# Patient Record
Sex: Female | Born: 1975 | Race: White | Hispanic: No | Marital: Married | Smoking: Never smoker
Health system: Southern US, Community
[De-identification: ages and names within clinical notes are randomized; demographics above are authoritative.]

## PROBLEM LIST (undated history)

## (undated) DIAGNOSIS — F419 Anxiety disorder, unspecified: Secondary | ICD-10-CM

## (undated) DIAGNOSIS — G43909 Migraine, unspecified, not intractable, without status migrainosus: Secondary | ICD-10-CM

## (undated) DIAGNOSIS — R42 Dizziness and giddiness: Secondary | ICD-10-CM

## (undated) HISTORY — PX: HERNIA REPAIR: SHX51

## (undated) HISTORY — DX: Anxiety disorder, unspecified: F41.9

## (undated) HISTORY — DX: Migraine, unspecified, not intractable, without status migrainosus: G43.909

## (undated) HISTORY — DX: Dizziness and giddiness: R42

## (undated) HISTORY — PX: APPENDECTOMY: SHX54

---
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
## 2002-08-25 ENCOUNTER — Other Ambulatory Visit: Admission: RE | Admit: 2002-08-25 | Discharge: 2002-08-25 | Payer: Self-pay | Admitting: Gynecology

## 2003-09-06 ENCOUNTER — Other Ambulatory Visit: Admission: RE | Admit: 2003-09-06 | Discharge: 2003-09-06 | Payer: Self-pay | Admitting: Obstetrics and Gynecology

## 2004-10-08 ENCOUNTER — Other Ambulatory Visit: Admission: RE | Admit: 2004-10-08 | Discharge: 2004-10-08 | Payer: Self-pay | Admitting: Obstetrics and Gynecology

## 2005-04-07 ENCOUNTER — Other Ambulatory Visit: Admission: RE | Admit: 2005-04-07 | Discharge: 2005-04-07 | Payer: Self-pay | Admitting: Obstetrics and Gynecology

## 2005-08-19 ENCOUNTER — Inpatient Hospital Stay (HOSPITAL_COMMUNITY): Admission: RE | Admit: 2005-08-19 | Discharge: 2005-08-22 | Payer: Self-pay | Admitting: Obstetrics and Gynecology

## 2005-10-21 ENCOUNTER — Other Ambulatory Visit: Admission: RE | Admit: 2005-10-21 | Discharge: 2005-10-21 | Payer: Self-pay | Admitting: Obstetrics and Gynecology

## 2006-06-23 ENCOUNTER — Encounter (INDEPENDENT_AMBULATORY_CARE_PROVIDER_SITE_OTHER): Payer: Self-pay | Admitting: *Deleted

## 2006-06-23 ENCOUNTER — Inpatient Hospital Stay (HOSPITAL_COMMUNITY): Admission: AD | Admit: 2006-06-23 | Discharge: 2006-06-23 | Payer: Self-pay | Admitting: Obstetrics and Gynecology

## 2006-06-23 ENCOUNTER — Ambulatory Visit (HOSPITAL_COMMUNITY): Admission: AD | Admit: 2006-06-23 | Discharge: 2006-06-24 | Payer: Self-pay | Admitting: *Deleted

## 2006-06-23 ENCOUNTER — Encounter (HOSPITAL_COMMUNITY): Payer: Self-pay | Admitting: Obstetrics and Gynecology

## 2008-07-04 ENCOUNTER — Inpatient Hospital Stay (HOSPITAL_COMMUNITY): Admission: RE | Admit: 2008-07-04 | Discharge: 2008-07-06 | Payer: Self-pay | Admitting: Obstetrics and Gynecology

## 2009-12-10 ENCOUNTER — Ambulatory Visit: Payer: Self-pay | Admitting: Genetic Counselor

## 2011-02-18 NOTE — Op Note (Signed)
[name], Kelsey              ACCOUNT NO.:  [id number]   MEDICAL RECORD NO.:  [id number]          PATIENT TYPE:  INP   LOCATION:  9134                          FACILITY:  [facility]   PHYSICIAN:  Michelle L. Grewal, M.D.DATE OF BIRTH:  Feb 04, 1976   DATE OF PROCEDURE:  07/04/2008  DATE OF DISCHARGE:                               OPERATIVE REPORT   PREOPERATIVE DIAGNOSIS:  Intrauterine pregnancy at 39 weeks and previous  C-section.   POSTOPERATIVE DIAGNOSIS:  Intrauterine pregnancy at 39 weeks and  previous C-section.   PROCEDURE:  Repeat low transverse cesarean section.   SURGEON:  Michelle L. Grewal, MD   ANESTHESIA:  Spinal.   FINDINGS:  Apgars 8 at one minute and 9 at five minutes.  Female infant  weighing 7 pounds 11 ounces.   ESTIMATED BLOOD LOSS:  500 mL.   PATHOLOGY:  None.   DRAINS:  Foley catheter.   COMPLICATIONS:  None.   PROCEDURE IN DETAIL:  The patient was taken to the operating room and  after informed consent was obtained, she was prepped and draped in usual  sterile fashion after spinal was placed without incident by Dr. Tacy Dura.  Foley catheter was inserted and draining clear urine.  A low transverse  incision was made, carried down to the fascia, and fascia scored in the  midline, extended laterally.  The rectus muscles were separated in the  midline.  The peritoneum was entered bluntly.  The peritoneal incision  was then stretched.  The bladder blade was inserted and the lower  uterine segment was identified, and then the bladder flap was created  sharply and then digitally.  The bladder blade was readjusted.  A low  transverse incision was made using a scalpel and the uterus was entered  using a hemostat.  Amniotic fluid was clear.  The baby was in cephalic  presentation and was delivered quite easily with the assistance of a  vacuum.  The baby was a female infant weighing 7 pounds 11 ounces, Apgars  8 at one minute and 9 at five minutes.  After the baby  was delivered, I  handed into the awaiting neonatal team and taken to the nursery.  The  placenta was manually removed and noted to be normal intact with a three-  vessel cord.  Of note, there was also a loose nuchal cord at the time of  delivery.  The uterus was exteriorized and cleared of all clots and  debris.  Pitocin and antibiotics have been given.  The uterus was closed  in one layer using 0 chromic in a running locked stitch.  Hemostasis was  excellent.  The uterus was returned to the abdomen.  Irrigation was  performed and hemostasis was again noted.  The peritoneum and rectus  muscles were reapproximated using 0 Vicryl.  The fascia was closed using  0 Vicryl, running stitch, and after irrigation of the subcutaneous  layer, the skin was closed with a subcuticular using 3-0 Vicryl and the  skin was closed with Dermabond skin adhesive.  All sponge, lap, and  instrument counts were correct x2.  The  patient went to recovery room in  stable condition.      Michelle L. Vincente Poli, M.D.  Electronically Signed     MLG/MEDQ  D:  07/04/2008  T:  07/04/2008  Job:  [id number]

## 2011-02-21 NOTE — H&P (Signed)
Kelsey [name], SARRATT NO.:  [id number]   MEDICAL RECORD NO.:  [id number]          PATIENT TYPE:  MAT   LOCATION:  MATC                          FACILITY:  [facility]   PHYSICIAN:  Alfonse Ras, MD   DATE OF BIRTH:  [birth year]   DATE OF ADMISSION:  06/23/2006  DATE OF DISCHARGE:  06/23/2006                                HISTORY & PHYSICAL   ADMISSION DIAGNOSIS:  Acute appendicitis.   ADMISSION PHYSICIAN:  Alfonse Ras, MD.   HOPI:  Patient is a 35 year old female with about an 18-20 hour history of  abdominal pain which is now localized to her right lower quadrant.  She was  seen by Dr. Vincente Poli and had a CAT scan which showed acute appendicitis.  CT  scan done today was consistent with acute appendicitis.  Patient had a white  count of 9.6 thousand.   PAST MEDICAL HISTORY:  None.   PAST SURGICAL HISTORY:  Significant for C-section 10 months ago.   MEDICATIONS:  None.   Patient is breastfeeding.   PHYSICAL EXAM:  She is an age appropriate white female in minimal distress.  She is quite anxious.  HEENT EXAM:  Benign.  Normocephalic, atraumatic.  Pupils are equal, round,  reactive to light.  LUNGS:  Clear to auscultation and percussion x2.  HEART:  Regular rate and rhythm.  ABDOMEN:  Soft but quite tender in the right lower quadrant with positive  rebound signs.  She has consistency with localized peritonitis consistent  with acute appendicitis.  EXTREMITIES:  No clubbing, cyanosis or edema.   IMPRESSION:  Acute appendicitis.   PLAN:  Transfer from [facility] to [facility] for  laparoscopic appendectomy.  I discussed the risks, benefits with the patient  and her husband including bleeding, infection, risk of having to convert to  an open procedure.  They understand and wish to proceed.      Alfonse Ras, MD  Electronically Signed     KRE/MEDQ  D:  06/23/2006  T:  06/23/2006  Job:  [id number]   cc:   Marcelino Duster L. Vincente Poli, M.D.  Fax: [phone]

## 2011-02-21 NOTE — Discharge Summary (Signed)
NAMEHILDA, Kelsey [name]              ACCOUNT NO.:  [id number]   MEDICAL RECORD NO.:  [id number]          PATIENT TYPE:  INP   LOCATION:  9134                          [facility]ACILITY:  [facility]   PHYSICIAN:  Guy Sandifer. Henderson Cloud, M.D. DATE OF BIRTH:  Feb 27, 1976   DATE OF ADMISSION:  07/04/2008  DATE OF DISCHARGE:  07/06/2008                               DISCHARGE SUMMARY   ADMITTING DIAGNOSES:  1. Intrauterine pregnancy at 19 weeks estimated gestational age.  2. Previous cesarean section, desires repeat.   DISCHARGE DIAGNOSES:  1. Status post low transverse cesarean section,  2. Viable female infant.   PROCEDURE:  Repeat low transverse cesarean section.   REASON FOR ADMISSION:  Please see written H&P.   HOSPITAL COURSE:  The patient is a 35 year old white married female  gravida 2, para 1 that presented to [facility] at 39  weeks estimated gestational age for scheduled cesarean section.  The  patient had had a previous cesarean section, desired repeat.  On the  morning of admission, the patient was taken to the operating room where  spinal anesthesia was admitted without difficulty.  A tow transverse  incision was made with delivery of a viable female infant weighing 7  pounds 11 ounces with Apgars of 9 at 1 minute and 9 at 5 minutes.  The  patient tolerated the procedure well and was taken to the recovery room  in stable condition.  On postoperative day #1, the patient was without  complaint.  Vital signs were stable.  She was afebrile.  Abdomen soft  with good return of bowel function.  Fundus was firm and nontender.  Incision was clean, dry, and intact.  Subcuticular closure was noted.  Laboratory findings revealed hemoglobin of 10.6, platelet count of  191,000, WBC count of 8.9.  Blood type was noted to be A+.  On  postoperative day #2, the patient did desire early discharge.  Vital  signs remained stable.  She was afebrile.  Abdomen soft.  Fundus firm  and nontender.  Incision  was clean, dry, and intact.  Discharge  instructions were reviewed and the patient was later discharged to home.   CONDITION ON DISCHARGE:  Stable.   DIET:  Regular as tolerated.   ACTIVITY:  No heavy lifting.  No driving x2 weeks.  No vaginal entry.   FOLLOWUP:  The patient will follow up in the office in 1-2 weeks for an  incision check.  She is to call for temperature greater than 100  degrees, persistent nausea, vomiting, heavy vaginal bleeding and/or  redness or drainage from incisional site.   DISCHARGE MEDICATIONS:  1. Tylox #30 one p.o. 4-6 hours p.r.n.  2. Motrin 600 mg every 6 hours.  3. Prenatal vitamins 1 p.o. daily.  4. Colace 1 p.o. daily p.r.n.      Julio Sicks, N.P.      Guy Sandifer. Henderson Cloud, M.D.  Electronically Signed    CC/MEDQ  D:  07/31/2008  T:  07/31/2008  Job:  [id number]

## 2011-03-14 ENCOUNTER — Institutional Professional Consult (permissible substitution): Payer: Self-pay | Admitting: Cardiology

## 2011-04-02 ENCOUNTER — Encounter: Payer: Self-pay | Admitting: Cardiology

## 2011-04-03 ENCOUNTER — Institutional Professional Consult (permissible substitution): Payer: Self-pay | Admitting: Cardiology

## 2011-05-12 ENCOUNTER — Encounter: Payer: Self-pay | Admitting: Cardiology

## 2011-05-13 ENCOUNTER — Encounter: Payer: Self-pay | Admitting: Cardiology

## 2011-05-13 ENCOUNTER — Ambulatory Visit (INDEPENDENT_AMBULATORY_CARE_PROVIDER_SITE_OTHER): Payer: BC Managed Care – PPO | Admitting: Cardiology

## 2011-05-13 VITALS — BP 133/92 | HR 87 | Resp 18 | Ht 63.0 in | Wt 133.3 lb

## 2011-05-13 DIAGNOSIS — R002 Palpitations: Secondary | ICD-10-CM

## 2011-05-13 NOTE — Patient Instructions (Signed)
Your physician has requested that you have an echocardiogram. Echocardiography is a painless test that uses sound waves to create images of your heart. It provides your doctor with information about the size and shape of your heart and how well your heart's chambers and valves are working. This procedure takes approximately one hour. There are no restrictions for this procedure.  Your physician recommends that you do not need to schedule a follow-up appointment at this time.

## 2011-05-13 NOTE — Progress Notes (Signed)
HPI Kelsey [name] is a delightful 35 year old married white female referred today by Dr. Vincente Poli for the evaluation and management of palpitations.  She describes having these intermittently for quite some time. However, she's been having them almost daily lately. She is under a lot of stress describes herself as a type A individual. She's also had a recent onset of migraine headaches.  The palpitations usually occur at random but seem to be exacerbated by stressful situations. She exercises 3-4 times a week but this is usually with her kids running around the treadmill.   She drinks about 6-8 ounces of caffeinated coffee per day. She on occasion one glass of wine. She does not binge drink.  Her palpitations are not associate with exercise. She has had no syncope or presyncope. She denies any chest pain. The palpitations also do not seem to be related to her migraines.  Her EKG today shows normal sinus rhythm, normal EKG. Her thyroid was recently tested and she was told it was normal. Past Medical History  Diagnosis Date  . Dizziness   . Migraine headache   . Anxiety     No past surgical history on file.  Family History  Problem Relation Age of Onset  . Lupus      family hx of    History   Social History  . Marital Status: Married    Spouse Name: N/A    Number of Children: N/A  . Years of Education: N/A   Occupational History  . Not on file.   Social History Main Topics  . Smoking status: Never Smoker   . Smokeless tobacco: Not on file  . Alcohol Use: Yes     1-2 per week  . Drug Use: No  . Sexually Active: Not on file   Other Topics Concern  . Not on file   Social History Narrative  . No narrative on file    No Known Allergies  Current Outpatient Prescriptions  Medication Sig Dispense Refill  . ALPRAZolam (XANAX) 0.25 MG tablet Take 0.25 mg by mouth at bedtime as needed.        . etonogestrel-ethinyl estradiol (NUVARING) 0.12-0.015 MG/24HR vaginal ring Place 1  each vaginally every 28 (twenty-eight) days. Insert vaginally and leave in place for 3 consecutive weeks, then remove for 1 week.       . meclizine (ANTIVERT) 25 MG tablet Take 25 mg by mouth 3 (three) times daily as needed.        . naproxen sodium (ANAPROX) 220 MG tablet Take 220 mg by mouth 2 (two) times daily with a meal.          ROS Negative other than HPI.   PE General Appearance: well developed, well nourished in no acute distress HEENT: symmetrical face, PERRLA, good dentition  Neck: no JVD, thyromegaly, or adenopathy, trachea midline Chest: symmetric without deformity Cardiac: PMI non-displaced, RRR, normal S1, S2, no gallop or murmur, no obvious click Lung: clear to ausculation and percussion Vascular: all pulses full without bruits  Abdominal: nondistended, nontender, good bowel sounds, no HSM, no bruits Extremities: no cyanosis, clubbing or edema, no sign of DVT, no varicosities  Skin: normal color, no rashes Neuro: alert and oriented x 3, non-focal Pysch: normal affect Filed Vitals:   05/13/11 1619  BP: 133/92  Pulse: 87  Resp: 18  Height: 5\' 3"  (1.6 m)  Weight: 133 lb 4.8 oz (60.464 kg)    EKG  Labs and Studies Reviewed.   No results found  for this basename: WBC, HGB, HCT, MCV, PLT      Chemistry   No results found for this basename: NA, K, CL, CO2, BUN, CREATININE, GLU   No results found for this basename: CALCIUM, ALKPHOS, AST, ALT, BILITOT       No results found for this basename: CHOL   No results found for this basename: HDL   No results found for this basename: LDLCALC   No results found for this basename: TRIG   No results found for this basename: CHOLHDL   No results found for this basename: HGBA1C   No results found for this basename: ALT, AST, GGT, ALKPHOS, BILITOT   No results found for this basename: TSH

## 2011-05-14 ENCOUNTER — Encounter: Payer: Self-pay | Admitting: *Deleted

## 2011-05-15 ENCOUNTER — Ambulatory Visit (HOSPITAL_COMMUNITY): Payer: BC Managed Care – PPO | Attending: Cardiology

## 2011-05-15 DIAGNOSIS — I0989 Other specified rheumatic heart diseases: Secondary | ICD-10-CM | POA: Insufficient documentation

## 2011-05-15 DIAGNOSIS — I079 Rheumatic tricuspid valve disease, unspecified: Secondary | ICD-10-CM | POA: Insufficient documentation

## 2011-05-15 DIAGNOSIS — I059 Rheumatic mitral valve disease, unspecified: Secondary | ICD-10-CM | POA: Insufficient documentation

## 2011-05-15 DIAGNOSIS — R002 Palpitations: Secondary | ICD-10-CM | POA: Insufficient documentation

## 2011-05-16 ENCOUNTER — Telehealth: Payer: Self-pay | Admitting: *Deleted

## 2011-05-16 NOTE — Telephone Encounter (Signed)
Pt aware of echo results.  Pt continues to have palpitations. She would like to know if she could wear a holter monitor to pick up on these palpitations?

## 2011-05-16 NOTE — Telephone Encounter (Signed)
She is aware Dr. Daleen Squibb is out of the office today. I will forward this message to him for review. Pt is tearful and "frustrated" with the palpitations. Reassurance given. Pt will talk with her pcp, Dr. Vincente Poli, about previous "normal" labs she had drawn. Mylo Red RN

## 2011-06-11 ENCOUNTER — Telehealth: Payer: Self-pay | Admitting: *Deleted

## 2011-06-11 NOTE — Telephone Encounter (Signed)
Message copied by Theda Belfast on Wed Jun 11, 2011  3:23 PM ------      Message from: Deliah Boston      Created: Wed Jun 11, 2011 11:16 AM       Debbie  Not sure if you ever got this message  I am in [place]DS trying to clean off [employer] top

## 2011-06-11 NOTE — Telephone Encounter (Signed)
Follow-up call to pt.  Pt states she is feeling much better and has reduced her stress.  She has only "had a couple of episodes which were not very noticeable" Reassurance given to pt and encouraged her to call us if she had any further problems. Mylo Red RN

## 2011-07-07 LAB — CBC
MCHC: 33.9
MCV: 93.9
Platelets: 191
Platelets: 247
RDW: 14.2
WBC: 8.8

## 2011-07-07 LAB — RPR: RPR Ser Ql: NONREACTIVE

## 2012-09-07 ENCOUNTER — Ambulatory Visit: Payer: BC Managed Care – PPO | Admitting: Physical Therapy

## 2012-09-07 ENCOUNTER — Ambulatory Visit: Payer: BC Managed Care – PPO | Attending: Neurology | Admitting: Rehabilitative and Restorative Service Providers"

## 2012-09-07 DIAGNOSIS — IMO0001 Reserved for inherently not codable concepts without codable children: Secondary | ICD-10-CM | POA: Insufficient documentation

## 2012-09-07 DIAGNOSIS — R42 Dizziness and giddiness: Secondary | ICD-10-CM | POA: Insufficient documentation

## 2012-09-10 ENCOUNTER — Ambulatory Visit: Payer: BC Managed Care – PPO | Admitting: Rehabilitative and Restorative Service Providers"

## 2012-09-10 ENCOUNTER — Encounter: Payer: BC Managed Care – PPO | Admitting: *Deleted

## 2012-09-13 ENCOUNTER — Ambulatory Visit: Payer: BC Managed Care – PPO | Admitting: Rehabilitative and Restorative Service Providers"

## 2012-09-14 ENCOUNTER — Encounter: Payer: BC Managed Care – PPO | Admitting: *Deleted

## 2012-09-17 ENCOUNTER — Ambulatory Visit: Payer: BC Managed Care – PPO | Admitting: Rehabilitative and Restorative Service Providers"

## 2012-09-17 ENCOUNTER — Encounter: Payer: BC Managed Care – PPO | Admitting: *Deleted

## 2012-09-21 ENCOUNTER — Encounter: Payer: BC Managed Care – PPO | Admitting: *Deleted

## 2012-09-21 ENCOUNTER — Ambulatory Visit: Payer: BC Managed Care – PPO | Admitting: Rehabilitative and Restorative Service Providers"

## 2012-09-23 ENCOUNTER — Other Ambulatory Visit: Payer: Self-pay | Admitting: Obstetrics and Gynecology

## 2012-09-24 ENCOUNTER — Ambulatory Visit: Payer: BC Managed Care – PPO | Admitting: Rehabilitative and Restorative Service Providers"

## 2012-09-24 ENCOUNTER — Encounter: Payer: BC Managed Care – PPO | Admitting: *Deleted

## 2012-10-01 ENCOUNTER — Ambulatory Visit: Payer: BC Managed Care – PPO | Admitting: Rehabilitative and Restorative Service Providers"

## 2012-10-05 ENCOUNTER — Encounter: Payer: BC Managed Care – PPO | Admitting: Rehabilitative and Restorative Service Providers"

## 2012-10-22 ENCOUNTER — Ambulatory Visit: Payer: BC Managed Care – PPO | Admitting: Rehabilitative and Restorative Service Providers"

## 2013-06-09 ENCOUNTER — Other Ambulatory Visit: Payer: Self-pay | Admitting: Dermatology

## 2013-09-13 ENCOUNTER — Other Ambulatory Visit: Payer: Self-pay | Admitting: Gastroenterology

## 2013-09-13 DIAGNOSIS — R1011 Right upper quadrant pain: Secondary | ICD-10-CM

## 2013-09-23 ENCOUNTER — Other Ambulatory Visit: Payer: Self-pay | Admitting: Obstetrics and Gynecology

## 2013-09-27 ENCOUNTER — Encounter (HOSPITAL_COMMUNITY): Payer: Self-pay | Admitting: Emergency Medicine

## 2013-09-27 ENCOUNTER — Emergency Department (HOSPITAL_COMMUNITY): Payer: BC Managed Care – PPO

## 2013-09-27 ENCOUNTER — Emergency Department (HOSPITAL_COMMUNITY)
Admission: EM | Admit: 2013-09-27 | Discharge: 2013-09-27 | Disposition: A | Discharge status: Home / Self Care | Payer: BC Managed Care – PPO | Attending: Emergency Medicine | Admitting: Emergency Medicine

## 2013-09-27 DIAGNOSIS — R110 Nausea: Secondary | ICD-10-CM | POA: Insufficient documentation

## 2013-09-27 DIAGNOSIS — Z79899 Other long term (current) drug therapy: Secondary | ICD-10-CM | POA: Insufficient documentation

## 2013-09-27 DIAGNOSIS — R630 Anorexia: Secondary | ICD-10-CM | POA: Insufficient documentation

## 2013-09-27 DIAGNOSIS — Z9089 Acquired absence of other organs: Secondary | ICD-10-CM | POA: Insufficient documentation

## 2013-09-27 DIAGNOSIS — Z3202 Encounter for pregnancy test, result negative: Secondary | ICD-10-CM | POA: Insufficient documentation

## 2013-09-27 DIAGNOSIS — Z8679 Personal history of other diseases of the circulatory system: Secondary | ICD-10-CM | POA: Insufficient documentation

## 2013-09-27 DIAGNOSIS — R1011 Right upper quadrant pain: Secondary | ICD-10-CM | POA: Insufficient documentation

## 2013-09-27 DIAGNOSIS — Z9889 Other specified postprocedural states: Secondary | ICD-10-CM | POA: Insufficient documentation

## 2013-09-27 DIAGNOSIS — Z8659 Personal history of other mental and behavioral disorders: Secondary | ICD-10-CM | POA: Insufficient documentation

## 2013-09-27 DIAGNOSIS — R109 Unspecified abdominal pain: Secondary | ICD-10-CM

## 2013-09-27 LAB — CBC WITH DIFFERENTIAL/PLATELET
Basophils Absolute: 0 10*3/uL (ref 0.0–0.1)
Basophils Relative: 0 % (ref 0–1)
Eosinophils Absolute: 0 10*3/uL (ref 0.0–0.7)
Eosinophils Relative: 1 % (ref 0–5)
HCT: 38.7 % (ref 36.0–46.0)
Hemoglobin: 13.6 g/dL (ref 12.0–15.0)
Lymphocytes Relative: 10 % — ABNORMAL LOW (ref 12–46)
Lymphs Abs: 0.8 10*3/uL (ref 0.7–4.0)
MCH: 31.4 pg (ref 26.0–34.0)
MCHC: 35.1 g/dL (ref 30.0–36.0)
MCV: 89.4 fL (ref 78.0–100.0)
Monocytes Absolute: 0.5 10*3/uL (ref 0.1–1.0)
Monocytes Relative: 6 % (ref 3–12)
Neutro Abs: 6.8 10*3/uL (ref 1.7–7.7)
Neutrophils Relative %: 83 % — ABNORMAL HIGH (ref 43–77)
Platelets: 220 10*3/uL (ref 150–400)
RBC: 4.33 MIL/uL (ref 3.87–5.11)
RDW: 12.3 % (ref 11.5–15.5)
WBC: 8.1 10*3/uL (ref 4.0–10.5)

## 2013-09-27 LAB — COMPREHENSIVE METABOLIC PANEL WITH GFR
ALT: 12 U/L (ref 0–35)
AST: 15 U/L (ref 0–37)
Albumin: 4 g/dL (ref 3.5–5.2)
Alkaline Phosphatase: 30 U/L — ABNORMAL LOW (ref 39–117)
BUN: 8 mg/dL (ref 6–23)
CO2: 26 meq/L (ref 19–32)
Calcium: 9.5 mg/dL (ref 8.4–10.5)
Chloride: 101 meq/L (ref 96–112)
Creatinine, Ser: 0.75 mg/dL (ref 0.50–1.10)
GFR calc Af Amer: >90 mL/min
GFR calc non Af Amer: >90 mL/min
Glucose, Bld: 100 mg/dL — ABNORMAL HIGH (ref 70–99)
Potassium: 3.7 meq/L (ref 3.5–5.1)
Sodium: 137 meq/L (ref 135–145)
Total Bilirubin: 0.6 mg/dL (ref 0.3–1.2)
Total Protein: 7.4 g/dL (ref 6.0–8.3)

## 2013-09-27 LAB — URINALYSIS, ROUTINE W REFLEX MICROSCOPIC
Bilirubin Urine: NEGATIVE
Glucose, UA: NEGATIVE mg/dL
Hgb urine dipstick: NEGATIVE
Ketones, ur: NEGATIVE mg/dL
Leukocytes, UA: NEGATIVE
Nitrite: NEGATIVE
Protein, ur: NEGATIVE mg/dL
Specific Gravity, Urine: 1.024 (ref 1.005–1.030)
Urobilinogen, UA: 1 mg/dL (ref 0.0–1.0)
pH: 6.5 (ref 5.0–8.0)

## 2013-09-27 LAB — POCT PREGNANCY, URINE: Preg Test, Ur: NEGATIVE

## 2013-09-27 LAB — LIPASE, BLOOD: Lipase: 24 U/L (ref 11–59)

## 2013-09-27 MED ORDER — ONDANSETRON 8 MG PO TBDP
8.0000 mg | ORAL_TABLET | Freq: Once | ORAL | Status: DC
  Filled 2013-09-27: qty 1

## 2013-09-27 NOTE — ED Provider Notes (Signed)
CSN: [id number]     Arrival date & time 09/27/13  4098 History   First MD Initiated Contact with Patient 09/27/13 [phone]     Chief Complaint  Patient presents with  . RUQ pain    (Consider location/radiation/quality/duration/timing/severity/associated sxs/prior Treatment) HPI Comments: Patient is a 37 year old female who presents to the emergency department with any of right upper part of abdominal pain intermittently x5 weeks. Patient states over the past 3 weeks her pain has slowly gotten worse, becoming severe over the past few days. Pain worse after eating or laying on her right side, occasionally radiating towards her back. Admits to decreased appetite, slight nausea, no vomiting. She saw her primary care physician and Dr. Loreta Ave with gastroenterology who scheduled her for a gallbladder ultrasound and HIDA scan on January 5, however pain became so severe last night that she decided to get evaluated sooner. Denies fever. History of appendectomy, C-section and hernia repair. Denies fever, chest pain or shortness of breath.  The history is provided by the patient.    Past Medical History  Diagnosis Date  . Dizziness   . Migraine headache   . Anxiety    Past Surgical History  Procedure Laterality Date  . Appendectomy    . Cesarean section    . Hernia repair     Family History  Problem Relation Age of Onset  . Lupus      family hx of   History  Substance Use Topics  . Smoking status: Never Smoker   . Smokeless tobacco: Not on file  . Alcohol Use: Yes     Comment: 1-2 per week   OB History   Grav Para Term Preterm Abortions TAB SAB Ect Mult Living                 Review of Systems  Constitutional: Positive for appetite change.  Gastrointestinal: Positive for nausea and abdominal pain.  All other systems reviewed and are negative.    Allergies  Review of patient's allergies indicates no known allergies.  Home Medications   Current Outpatient Rx  Name  Route  Sig   Dispense  Refill  . etonogestrel-ethinyl estradiol (NUVARING) 0.12-0.015 MG/24HR vaginal ring   Vaginal   Place 1 each vaginally every 28 (twenty-eight) days. Insert vaginally and leave in place for 3 consecutive weeks, then remove for 1 week.           BP 142/100  Pulse 84  Temp(Src) 99.1 F (37.3 C) (Oral)  Resp 20  SpO2 99%  LMP 09/08/2013 Physical Exam  Nursing note and vitals reviewed. Constitutional: She is oriented to person, place, and time. She appears well-developed and well-nourished. No distress.  HENT:  Head: Normocephalic and atraumatic.  Mouth/Throat: Oropharynx is clear and moist.  Eyes: Conjunctivae are normal.  Neck: Normal range of motion. Neck supple.  Cardiovascular: Normal rate, regular rhythm and normal heart sounds.   Pulmonary/Chest: Effort normal and breath sounds normal.  Abdominal: Soft. Normal appearance and bowel sounds are normal. She exhibits no distension and no mass. There is tenderness (mild, deep palpation only) in the right upper quadrant. There is no rigidity, no rebound, no guarding and negative Murphy's sign.  No peritoneal signs.  Musculoskeletal: Normal range of motion. She exhibits no edema.  Neurological: She is alert and oriented to person, place, and time.  Skin: Skin is warm and dry. She is not diaphoretic.  Psychiatric: She has a normal mood and affect. Her behavior is normal.  ED Course  Procedures (including critical care time) Labs Review Labs Reviewed  CBC WITH DIFFERENTIAL - Abnormal; Notable for the following:    Neutrophils Relative % 83 (*)    Lymphocytes Relative 10 (*)    All other components within normal limits  COMPREHENSIVE METABOLIC PANEL - Abnormal; Notable for the following:    Glucose, Bld 100 (*)    Alkaline Phosphatase 30 (*)    All other components within normal limits  LIPASE, BLOOD  URINALYSIS, ROUTINE W REFLEX MICROSCOPIC   Imaging Review US Abdomen Complete  09/27/2013   CLINICAL DATA:   Abdominal pain  EXAM: ULTRASOUND ABDOMEN COMPLETE  COMPARISON:  CT abdomen and pelvis June 23, 2006  FINDINGS: Gallbladder:  No gallstones or wall thickening visualized. There is no pericholecystic fluid. No sonographic Murphy sign noted.  Common bile duct:  Diameter: 3 mm. There is no intrahepatic, common hepatic, or common bile duct dilatation.  Liver:  No focal lesion identified. Within normal limits in parenchymal echogenicity.  IVC:  No abnormality visualized.  Pancreas:  No mass or inflammatory focus.  Spleen:  Size and appearance within normal limits.  Right Kidney:  Length: 10.6 cm. Echogenicity within normal limits. No mass or hydronephrosis visualized.  Left Kidney:  Length: 10.1 cm. Echogenicity within normal limits. No mass or hydronephrosis visualized.  Abdominal aorta:  No aneurysm visualized.  Other findings:  No demonstrable ascites.  IMPRESSION: Study within normal limits.   Electronically Signed   By: Bretta Bang M.D.   On: 09/27/2013 10:00    EKG Interpretation   None       MDM   1. Abdominal pain     Pt presenting with RUQ abdominal pain, mild nausea, decreased appetite. He is well appearing and in no apparent distress. Concern for gallbladder pathology by PCP and Dr. Loreta Ave. Abdominal ultrasound and labs pending. NPO. She has not had anything to eat since 7:30 last night. Does not want anything for pain at this time. 10:45 AM Abdominal US negative for any acute finding. Labs normal. Advised her to f/u with Dr. Loreta Ave for HIDA. Still refuses pain meds in ED as pain currently "not that bad". On re-exam, abdomen soft, mild tenderness to deep palpation only. Stable for discharge. Return precautions given. Patient states understanding of treatment care plan and is agreeable.   Trevor Mace, PA-C 09/27/13 1046

## 2013-09-27 NOTE — ED Provider Notes (Signed)
Medical screening examination/treatment/procedure(s) were performed by non-physician practitioner and as supervising physician I was immediately available for consultation/collaboration.  EKG Interpretation   None        Ethelda Chick, MD 09/27/13 1048

## 2013-09-27 NOTE — ED Notes (Signed)
Pt states that for 5 weeks she has had RUQ pain that has progressively gotten worse even if she doesn't eat.  Last night she couldn't eat or sleep bc of the pain. Pt states she has an [place] study scheduled for Jan. 2015 to check her gallbladder that she scheduled with her GI doctor but pain has been to intense so wanted to be evaluated by ED.

## 2013-10-10 ENCOUNTER — Encounter (HOSPITAL_COMMUNITY)
Admission: RE | Admit: 2013-10-10 | Discharge: 2013-10-10 | Disposition: A | Discharge status: Home / Self Care | Payer: BC Managed Care – PPO | Source: Ambulatory Visit | Attending: Gastroenterology | Admitting: Gastroenterology

## 2013-10-10 ENCOUNTER — Ambulatory Visit (HOSPITAL_COMMUNITY): Payer: BC Managed Care – PPO

## 2013-10-10 DIAGNOSIS — R1011 Right upper quadrant pain: Secondary | ICD-10-CM | POA: Insufficient documentation

## 2013-10-10 MED ORDER — TECHNETIUM TC 99M MEBROFENIN IV KIT
5.0000 | PACK | Freq: Once | INTRAVENOUS | Status: AC | PRN
  Administered 2013-10-10: 5 via INTRAVENOUS

## 2013-10-12 ENCOUNTER — Other Ambulatory Visit: Payer: Self-pay | Admitting: Obstetrics and Gynecology

## 2013-10-12 DIAGNOSIS — R928 Other abnormal and inconclusive findings on diagnostic imaging of breast: Secondary | ICD-10-CM

## 2013-10-13 ENCOUNTER — Ambulatory Visit
Admission: RE | Admit: 2013-10-13 | Discharge: 2013-10-13 | Disposition: A | Discharge status: Home / Self Care | Payer: BC Managed Care – PPO | Source: Ambulatory Visit | Attending: Obstetrics and Gynecology | Admitting: Obstetrics and Gynecology

## 2013-10-13 DIAGNOSIS — R928 Other abnormal and inconclusive findings on diagnostic imaging of breast: Secondary | ICD-10-CM

## 2013-10-17 ENCOUNTER — Encounter (INDEPENDENT_AMBULATORY_CARE_PROVIDER_SITE_OTHER): Payer: Self-pay | Admitting: Surgery

## 2013-10-17 ENCOUNTER — Ambulatory Visit (INDEPENDENT_AMBULATORY_CARE_PROVIDER_SITE_OTHER): Payer: BC Managed Care – PPO | Admitting: Surgery

## 2013-10-17 VITALS — BP 118/68 | HR 76 | Temp 98.8°F | Resp 14 | Ht 62.75 in | Wt 137.2 lb

## 2013-10-17 DIAGNOSIS — R1011 Right upper quadrant pain: Secondary | ICD-10-CM

## 2013-10-17 NOTE — Progress Notes (Signed)
Patient ID: Kelsey [name], female   DOB: [birth year], [age] y.o.   MRN: [id number]  Chief Complaint  Patient presents with  . New Evaluation    eval GB    HPI Kelsey [name] is a 38 y.o. female.  GI - Dr. Jolee EwingJyothi Mann/ GYN Dr. Vincente PoliGrewal HPI This is a healthy 38 year old female who presents with a two-month history of right upper quadrant and right flank pain. Initially, her pain was located mostly in her right upper quadrant and was intermittent. There does not seem to be any associated symptoms. She denies any nausea or vomiting. She does have a small amount of abdominal bloating. She denies any diarrhea or other changes in her bowel habits. She cannot correlate her symptoms with eating although sometimes she gets worsening of her symptoms shortly after having a meal. There are not any foods that cause more problems than the others. Her pain seems to have moved around her right side to the edge of her right costal margin. She had an evaluation by Dr. Loreta AveMann. An ultrasound showed no sign of gallstones. A HIDA scan showed normal filling and a gallbladder ejection fraction of 57%. She presents now for further evaluation.  She had an emergent laparoscopic appendectomy for acute appendicitis by Dr. Colin BentonEarle. In 2008 she underwent repair of a small ventral incisional hernia in her epigastrium. This was repaired by Dr. Abbey Chattersosenbower with a small piece of inlay mesh. She has also had 2 C-sections.  Past Medical History  Diagnosis Date  . Dizziness   . Migraine headache   . Anxiety     Past Surgical History  Procedure Laterality Date  . Appendectomy    . Hernia repair    . Tonsillectomy  1992  . Cesarean section      2    Family History  Problem Relation Age of Onset  . Lupus      family hx of  . Cancer Mother     breast  . Cancer Maternal Grandmother     ovarian  . Cancer Paternal Grandmother     bladder    Social History History  Substance Use Topics  . Smoking status: Never Smoker   .  Smokeless tobacco: Never Used  . Alcohol Use: Yes     Comment: 1-2 per week    No Known Allergies  Current Outpatient Prescriptions  Medication Sig Dispense Refill  . etonogestrel-ethinyl estradiol (NUVARING) 0.12-0.015 MG/24HR vaginal ring Place 1 each vaginally every 28 (twenty-eight) days. Insert vaginally and leave in place for 3 consecutive weeks, then remove for 1 week.        No current facility-administered medications for this visit.    Review of Systems Review of Systems  Constitutional: Negative for fever, chills and unexpected weight change.  HENT: Negative for congestion, hearing loss, sore throat, trouble swallowing and voice change.   Eyes: Negative for visual disturbance.  Respiratory: Negative for cough and wheezing.   Cardiovascular: Negative for chest pain, palpitations and leg swelling.  Gastrointestinal: Positive for abdominal pain and abdominal distention. Negative for nausea, vomiting, diarrhea, constipation, blood in stool and anal bleeding.  Genitourinary: Positive for flank pain. Negative for hematuria, vaginal bleeding and difficulty urinating.  Musculoskeletal: Negative for arthralgias.  Skin: Negative for rash and wound.  Neurological: Negative for seizures, syncope and headaches.  Hematological: Negative for adenopathy. Does not bruise/bleed easily.  Psychiatric/Behavioral: Negative for confusion.    Blood pressure 118/68, pulse 76, temperature 98.8 F (37.1 C), temperature source Temporal, resp. rate  14, height 5' 2.75" (1.594 m), weight 137 lb 3.2 oz (62.234 kg), last menstrual period 09/09/2013.  Physical Exam Physical Exam WDWN in NAD HEENT:  EOMI, sclera anicteric Neck:  No masses, no thyromegaly Lungs:  CTA bilaterally; normal respiratory effort CV:  Regular rate and rhythm; no murmurs Abd:  +bowel sounds, soft, mildly tender in RUQ and laterally in mid-axillary line one the right at the edge of the costal margin Ext:  Well-perfused; no  edema Skin:  Warm, dry; no sign of jaundice  Data Reviewed US Abdomen Complete  09/27/2013   CLINICAL DATA:  Abdominal pain  EXAM: ULTRASOUND ABDOMEN COMPLETE  COMPARISON:  CT abdomen and pelvis June 23, 2006  FINDINGS: Gallbladder:  No gallstones or wall thickening visualized. There is no pericholecystic fluid. No sonographic Murphy sign noted.  Common bile duct:  Diameter: 3 mm. There is no intrahepatic, common hepatic, or common bile duct dilatation.  Liver:  No focal lesion identified. Within normal limits in parenchymal echogenicity.  IVC:  No abnormality visualized.  Pancreas:  No mass or inflammatory focus.  Spleen:  Size and appearance within normal limits.  Right Kidney:  Length: 10.6 cm. Echogenicity within normal limits. No mass or hydronephrosis visualized.  Left Kidney:  Length: 10.1 cm. Echogenicity within normal limits. No mass or hydronephrosis visualized.  Abdominal aorta:  No aneurysm visualized.  Other findings:  No demonstrable ascites.  IMPRESSION: Study within normal limits.   Electronically Signed   By: Bretta Bang M.D.   On: 09/27/2013 10:00   Nm Hepato W/eject Fract  10/10/2013   CLINICAL DATA:  Right upper quadrant pain  EXAM: NUCLEAR MEDICINE HEPATOBILIARY IMAGING WITH GALLBLADDER EF  TECHNIQUE: Sequential images of the abdomen were obtained out to 60 minutes following intravenous administration of radiopharmaceutical. After oral ingestion of 8 ounces of Half-and-Half cream, gallbladder ejection fraction was determined.  COMPARISON:  None.  RADIOPHARMACEUTICALS:  5.47mCi Tc-85m Choletec  FINDINGS: Gallbladder activity occurs after 10 min. Small bowel activity occurs after 25 min. Gallbladder ejection fraction is 57%. Normal is greater than 33%.  The patient did experience slight pain symptoms after oral ingestion of Half-and-Half cream.  IMPRESSION: Cystic and common bile ducts are patent. Normal gallbladder ejection fraction.   Electronically Signed   By: Maryclare Bean M.D.    On: 10/10/2013 12:38    Assessment    RUQ of unclear etiology.  Her symptoms are not completely classic for gallbladder disease, Although she does have some postprandial exacerbation. Her ultrasound and her HIDA scan were unremarkable.    Plan    We will obtain a CT scan of the abdomen and pelvis with contrast. This will give [place] more information to make sure there is not some other etiology for her pain. If this is normal, we will consider elective cholecystectomy. We will discuss this further with the patient after her CT scan is complete.        Londyn Hotard K. 10/17/2013, 10:02 AM

## 2013-10-18 ENCOUNTER — Ambulatory Visit (HOSPITAL_COMMUNITY): Payer: BC Managed Care – PPO

## 2013-10-19 ENCOUNTER — Other Ambulatory Visit: Payer: BC Managed Care – PPO

## 2013-10-20 ENCOUNTER — Ambulatory Visit
Admission: RE | Admit: 2013-10-20 | Discharge: 2013-10-20 | Disposition: A | Discharge status: Home / Self Care | Payer: BC Managed Care – PPO | Source: Ambulatory Visit | Attending: Surgery | Admitting: Surgery

## 2013-10-20 DIAGNOSIS — R1011 Right upper quadrant pain: Secondary | ICD-10-CM

## 2013-10-20 MED ORDER — IOHEXOL 300 MG/ML  SOLN
100.0000 mL | Freq: Once | INTRAMUSCULAR | Status: AC | PRN
  Administered 2013-10-20: 100 mL via INTRAVENOUS

## 2013-10-21 ENCOUNTER — Telehealth (INDEPENDENT_AMBULATORY_CARE_PROVIDER_SITE_OTHER): Payer: Self-pay | Admitting: General Surgery

## 2013-10-21 NOTE — Progress Notes (Signed)
Quick Note:  Please call the patient and let them know that their CT scan was normal. I still want to review the notes from Dr. Kenna GilbertMann's office before I want to give her a recommendation regarding surgery. Let her know that I will be back in town next week and will call her to discuss.  Please also check with Alisha to see if we have received the paperwork from Dr. Kenna GilbertMann's office. ______

## 2013-10-21 NOTE — Telephone Encounter (Signed)
Called patient this morning per Dr. Corliss Skainssuei to let her know  that her CT was normal, when she started to go on about how she was feeling. She stated that she was having pain in her right rib at the bone area and in her right hip and she stated that the pain was going 4 inches to her gut, below her gallbladder. I asked her if she had any fevers or any N/V and she stated no, but this morning that she stated that she could vomit. She said that she was under so stress due to a died in her family and stressing over the wait of the CT Scan. I asked her if the pain was worse then it was the last time she saw Dr Corliss Skainssuei on 10-17-13 and she stated that it was a scale of 5 in pain. I told her that I will text Dr Corliss Skainssuei to see what I needed to do about the patient. Before me and Dr Corliss Skainssuei got to talk the patient called back and talked to me and stated that she will wait on Dr Corliss Skainssuei to review her note and go from there. And I told him that when he called into the nurses office and I told him everything about the patient and what is going on with her. We scanned the notes from Dr Kenna GilbertMann's and sent them to Dr Corliss Skainssuei e-mail and he will review them and he will let me know what I need to do. And I relay the message back to Kelsey [name] that Dr Corliss Skainssuei will review the notes. And I will call her if I need to.

## 2013-10-25 ENCOUNTER — Other Ambulatory Visit (INDEPENDENT_AMBULATORY_CARE_PROVIDER_SITE_OTHER): Payer: Self-pay | Admitting: Surgery

## 2013-10-25 ENCOUNTER — Ambulatory Visit (INDEPENDENT_AMBULATORY_CARE_PROVIDER_SITE_OTHER): Payer: Self-pay | Admitting: Surgery

## 2013-10-25 NOTE — Progress Notes (Signed)
The patient's CT scan shows no significant findings other than some mild fatty infiltration of the liver. Certainly, there is no obvious explanation for her right upper quadrant abdominal pain. I spoke with the patient by phone and we discussed this thoroughly. Her symptoms seem to be worsening. Due to her worsening symptoms, we will proceed with laparoscopic cholecystectomy with intraoperative cholangiogram with the understanding that there is no guarantee that this will resolve her symptoms. Certainly her symptoms sound highly suspicious for gallbladder disease but there is no objective evidence.  The surgical procedure has been discussed with the patient.  Potential risks, benefits, alternative treatments, and expected outcomes have been explained.  All of the patient's questions at this time have been answered.  The likelihood of reaching the patient's treatment goal is good.  The patient understand the proposed surgical procedure and wishes to proceed.  Wilmon ArmsMatthew K. Corliss Skainssuei, MD, [facility]  General/ Trauma Surgery  10/25/2013 9:02 AM

## 2013-10-27 ENCOUNTER — Telehealth (INDEPENDENT_AMBULATORY_CARE_PROVIDER_SITE_OTHER): Payer: Self-pay | Admitting: Surgery

## 2013-10-27 NOTE — Telephone Encounter (Signed)
Called patient to schedule surgery, gave patient financial responsibilities, will call to schedule

## 2013-10-28 ENCOUNTER — Telehealth (INDEPENDENT_AMBULATORY_CARE_PROVIDER_SITE_OTHER): Payer: Self-pay | Admitting: General Surgery

## 2013-10-28 NOTE — Telephone Encounter (Signed)
LMOM for patient to call [facility] and [facility]. [facility] and stated that she had more gallbladder question about surgery

## 2013-11-02 ENCOUNTER — Telehealth (INDEPENDENT_AMBULATORY_CARE_PROVIDER_SITE_OTHER): Payer: Self-pay

## 2013-11-02 NOTE — Telephone Encounter (Signed)
Patient called stating she was filling out paperwork for surgical center and it said for her to stop vitamins 2 weeks prior to surgery. She has been on citracal and fish oil for about a week. Advised for her to stop them now and also to stay away from any nsaids and she will be okay for surgery. Patient agrees.

## 2013-11-08 ENCOUNTER — Other Ambulatory Visit (INDEPENDENT_AMBULATORY_CARE_PROVIDER_SITE_OTHER): Payer: Self-pay | Admitting: *Deleted

## 2013-11-08 ENCOUNTER — Other Ambulatory Visit (INDEPENDENT_AMBULATORY_CARE_PROVIDER_SITE_OTHER): Payer: Self-pay | Admitting: Surgery

## 2013-11-08 DIAGNOSIS — K810 Acute cholecystitis: Secondary | ICD-10-CM

## 2013-11-08 MED ORDER — OXYCODONE-ACETAMINOPHEN 5-325 MG PO TABS: 1.0000 | ORAL_TABLET | ORAL | Status: AC | PRN

## 2013-11-11 ENCOUNTER — Telehealth (INDEPENDENT_AMBULATORY_CARE_PROVIDER_SITE_OTHER): Payer: Self-pay

## 2013-11-11 NOTE — Telephone Encounter (Signed)
Pt called in asking if she could start taking ibuprofen now. She wants to stop narcotics. Advised her ok to switch if ibuprofen helps with the pain. She still has pains off and on when switching positions but it is slowly getting better. I told her she is only 3 days out and she could have this for another week or so. Patient understands.

## 2013-11-21 ENCOUNTER — Telehealth (INDEPENDENT_AMBULATORY_CARE_PROVIDER_SITE_OTHER): Payer: Self-pay | Admitting: General Surgery

## 2013-11-21 NOTE — Telephone Encounter (Signed)
Called patient and she is coming in to see Dr Corliss Skainssuei on 11-25-13

## 2013-11-21 NOTE — Telephone Encounter (Signed)
LMOM for patient to call back and ask for Kelsey [name] 

## 2013-11-22 ENCOUNTER — Encounter (INDEPENDENT_AMBULATORY_CARE_PROVIDER_SITE_OTHER): Payer: BC Managed Care – PPO | Admitting: Surgery

## 2013-11-23 ENCOUNTER — Encounter (INDEPENDENT_AMBULATORY_CARE_PROVIDER_SITE_OTHER): Payer: BC Managed Care – PPO | Admitting: Surgery

## 2013-11-23 ENCOUNTER — Telehealth (INDEPENDENT_AMBULATORY_CARE_PROVIDER_SITE_OTHER): Payer: Self-pay | Admitting: General Surgery

## 2013-11-23 NOTE — Telephone Encounter (Signed)
Pt called to report that since Monday she has experienced an increased soreness and tenderness to touch in her Rt side.  She had lap chole on 11/08/13 and has been recovered very well until now.  Upon reflection, she bumped into the back of a chair (left pulled out by her children) on Monday.  Explained that this could be the source of the onset of discomfort.  Recommended ice pack and NSAIDs for the anti-inflammatory effects.  She states the discomfort is less today than yesterday, and she was calling for reassurance, which she has been given.

## 2013-11-25 ENCOUNTER — Encounter (INDEPENDENT_AMBULATORY_CARE_PROVIDER_SITE_OTHER): Payer: Self-pay | Admitting: Surgery

## 2013-11-25 ENCOUNTER — Other Ambulatory Visit (INDEPENDENT_AMBULATORY_CARE_PROVIDER_SITE_OTHER): Payer: Self-pay | Admitting: Surgery

## 2013-11-25 ENCOUNTER — Other Ambulatory Visit (INDEPENDENT_AMBULATORY_CARE_PROVIDER_SITE_OTHER): Payer: Self-pay

## 2013-11-25 ENCOUNTER — Ambulatory Visit (INDEPENDENT_AMBULATORY_CARE_PROVIDER_SITE_OTHER): Payer: BC Managed Care – PPO | Admitting: Surgery

## 2013-11-25 DIAGNOSIS — Z9049 Acquired absence of other specified parts of digestive tract: Secondary | ICD-10-CM

## 2013-11-25 DIAGNOSIS — K811 Chronic cholecystitis: Secondary | ICD-10-CM

## 2013-11-25 LAB — COMPREHENSIVE METABOLIC PANEL
ALT: 13 U/L (ref 0–35)
AST: 16 U/L (ref 0–37)
Albumin: 4.4 g/dL (ref 3.5–5.2)
Alkaline Phosphatase: 29 U/L — ABNORMAL LOW (ref 39–117)
BUN: 8 mg/dL (ref 6–23)
CO2: 26 mEq/L (ref 19–32)
Calcium: 9.6 mg/dL (ref 8.4–10.5)
Chloride: 102 mEq/L (ref 96–112)
Creat: 0.7 mg/dL (ref 0.50–1.10)
Glucose, Bld: 89 mg/dL (ref 70–99)
Potassium: 3.8 mEq/L (ref 3.5–5.3)
Sodium: 138 mEq/L (ref 135–145)
Total Bilirubin: 0.5 mg/dL (ref 0.2–1.2)
Total Protein: 6.9 g/dL (ref 6.0–8.3)

## 2013-11-25 LAB — CBC
HCT: 39.1 % (ref 36.0–46.0)
Hemoglobin: 13.3 g/dL (ref 12.0–15.0)
MCH: 30.6 pg (ref 26.0–34.0)
MCHC: 34 g/dL (ref 30.0–36.0)
MCV: 90.1 fL (ref 78.0–100.0)
PLATELETS: 265 10*3/uL (ref 150–400)
RBC: 4.34 MIL/uL (ref 3.87–5.11)
RDW: 13.5 % (ref 11.5–15.5)
WBC: 5.8 10*3/uL (ref 4.0–10.5)

## 2013-11-25 NOTE — Progress Notes (Signed)
The patient returns after a recent laparoscopic cholecystectomy. Initially she was doing reasonably well. The soreness resolves and she regained her appetite. She denies any problems with diarrhea. However 3 days ago she had a brief incident where she turned around quickly in the kitchen and ran into a chair. Since that time she has had fairly significant pain in her epigastrium and her right upper quadrant. She denies any nausea or vomiting.  Her incisions are all well-healed with no sign of infection. No sign of hematoma. She has minimal tenderness around her right upper quadrant incisions. I cannot palpate any masses in this area.  Her pathology showed chronic cholecystitis.  She continues to have some right costal margin pain is well. It is possible that when she ran into the chair that she stretch some of these muscles as they were healing. I do not see any sign of infection or hematoma. We will check lab work today to make that there are no liver function abnormalities. If these are normal the wound is wait for her symptoms to improve. We will contact her after her labs are complete.  Wilmon ArmsMatthew K. Corliss Skainssuei, MD, [facility]  General/ Trauma Surgery  11/25/2013 12:30 PM

## 2013-12-08 ENCOUNTER — Encounter (INDEPENDENT_AMBULATORY_CARE_PROVIDER_SITE_OTHER): Payer: Self-pay

## 2014-02-06 ENCOUNTER — Other Ambulatory Visit (INDEPENDENT_AMBULATORY_CARE_PROVIDER_SITE_OTHER): Payer: Self-pay | Admitting: Surgery

## 2014-02-06 ENCOUNTER — Telehealth (INDEPENDENT_AMBULATORY_CARE_PROVIDER_SITE_OTHER): Payer: Self-pay | Admitting: General Surgery

## 2014-02-06 ENCOUNTER — Other Ambulatory Visit (INDEPENDENT_AMBULATORY_CARE_PROVIDER_SITE_OTHER): Payer: Self-pay | Admitting: General Surgery

## 2014-02-06 DIAGNOSIS — Z8719 Personal history of other diseases of the digestive system: Secondary | ICD-10-CM

## 2014-02-06 LAB — CBC
HEMATOCRIT: 39.2 % (ref 36.0–46.0)
HEMOGLOBIN: 13.2 g/dL (ref 12.0–15.0)
MCH: 30.3 pg (ref 26.0–34.0)
MCHC: 33.7 g/dL (ref 30.0–36.0)
MCV: 89.9 fL (ref 78.0–100.0)
Platelets: 260 10*3/uL (ref 150–400)
RBC: 4.36 MIL/uL (ref 3.87–5.11)
RDW: 13.1 % (ref 11.5–15.5)
WBC: 5.3 10*3/uL (ref 4.0–10.5)

## 2014-02-06 NOTE — Telephone Encounter (Signed)
LMOM for patient to call back and ask for Kelsey [name] 

## 2014-02-06 NOTE — Telephone Encounter (Signed)
Called patient and she is going today to get lab work done today or tomorrow at the [facility] [facility]

## 2014-02-07 ENCOUNTER — Telehealth (INDEPENDENT_AMBULATORY_CARE_PROVIDER_SITE_OTHER): Payer: Self-pay | Admitting: General Surgery

## 2014-02-07 LAB — COMPREHENSIVE METABOLIC PANEL
ALBUMIN: 4.5 g/dL (ref 3.5–5.2)
ALT: 17 U/L (ref 0–35)
AST: 18 U/L (ref 0–37)
Alkaline Phosphatase: 30 U/L — ABNORMAL LOW (ref 39–117)
BUN: 11 mg/dL (ref 6–23)
CALCIUM: 9.7 mg/dL (ref 8.4–10.5)
CO2: 31 meq/L (ref 19–32)
Chloride: 102 mEq/L (ref 96–112)
Creat: 0.79 mg/dL (ref 0.50–1.10)
Glucose, Bld: 94 mg/dL (ref 70–99)
POTASSIUM: 3.8 meq/L (ref 3.5–5.3)
Sodium: 139 mEq/L (ref 135–145)
Total Bilirubin: 0.6 mg/dL (ref 0.2–1.2)
Total Protein: 7.1 g/dL (ref 6.0–8.3)

## 2014-02-07 NOTE — Progress Notes (Signed)
Quick Note:  Please call the patient and let them know that their labs are normal. If she wants to be seen, please schedule her an appt. She should stay on the Prilosec for a couple of months. ______

## 2014-02-07 NOTE — Telephone Encounter (Signed)
LMOM for patient to call back to ask for Kelsey [name] 

## 2014-02-07 NOTE — Telephone Encounter (Signed)
Called patient to let her know that her lab work is normal and I told her to do the Prilosec for couple months. And I also told her that if she needs to come back in to see Dr Corliss Skainssuei that she can call me, she stated that she will once she get back in town if she not feeling any better

## 2014-08-25 IMAGING — US US ABDOMEN COMPLETE
1 series · 14 of 25 positions shown · non-contrast
Comparison: CT abdomen and pelvis June 23, 2006

CLINICAL DATA: Abdominal pain

EXAM:
ULTRASOUND ABDOMEN COMPLETE

[Series 1: us abdomen complete · 0.22mm/px · 14 of 87 slices shown]
[im 1/87]
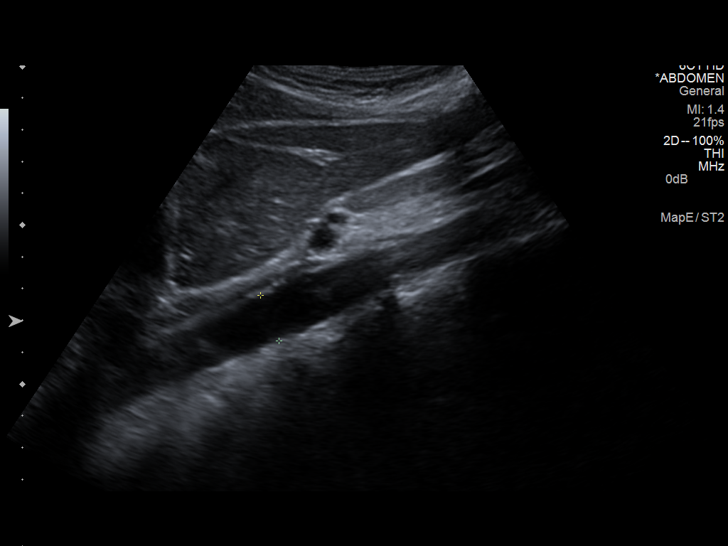
[im 8/87]
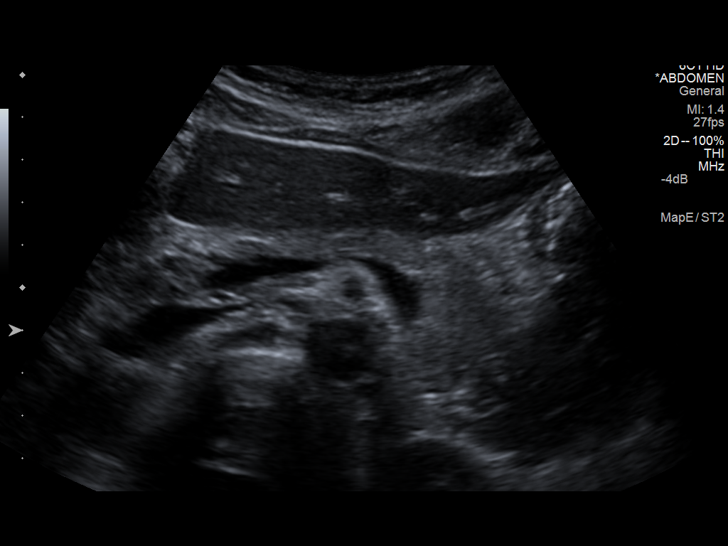
[im 15/87]
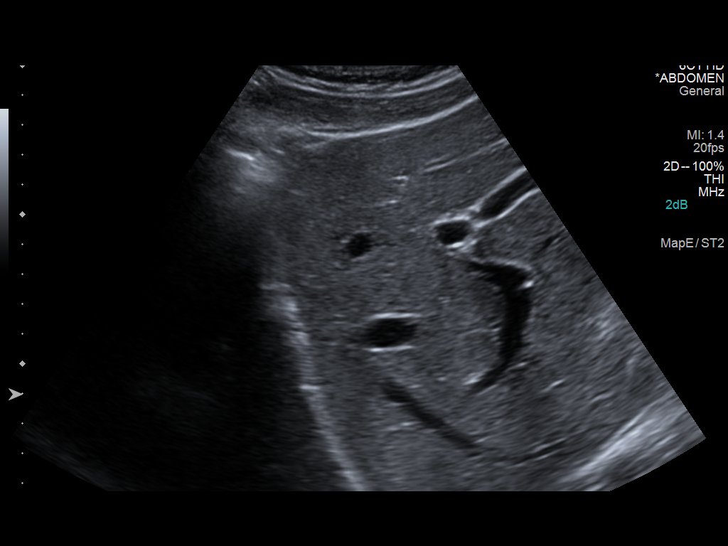
[im 22/87]
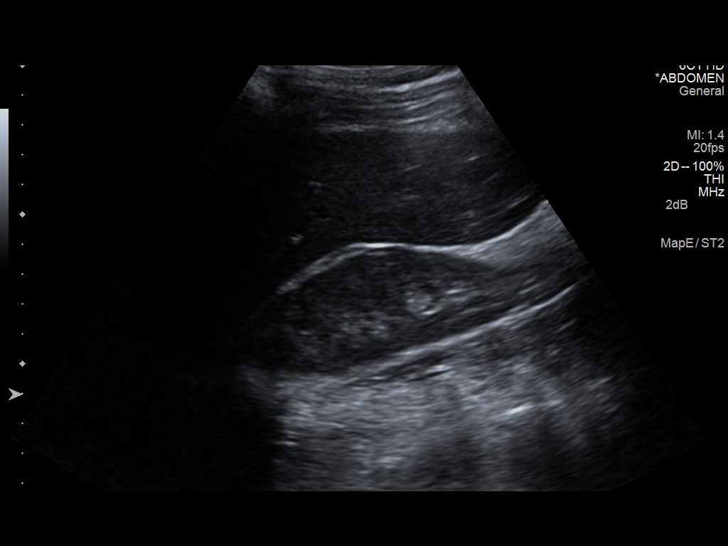
[im 29/87]
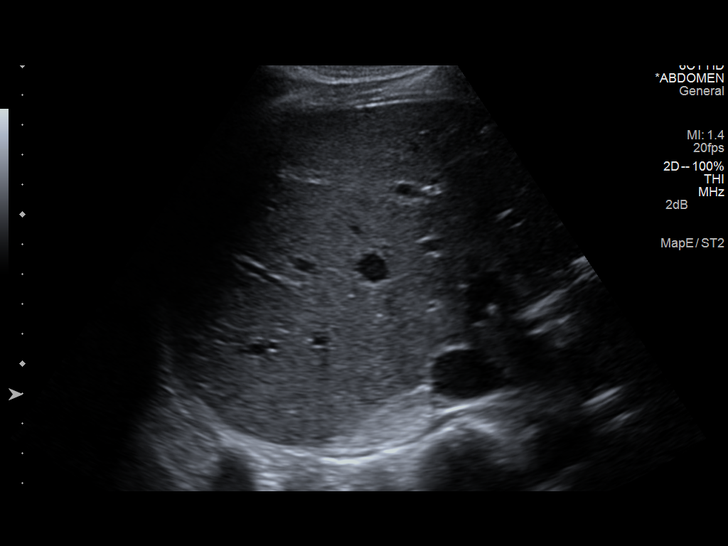
[im 33/87]
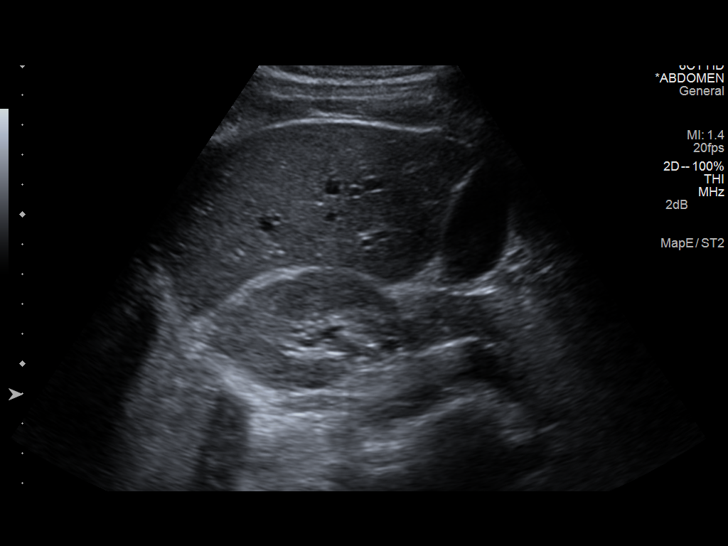
[im 40/87]
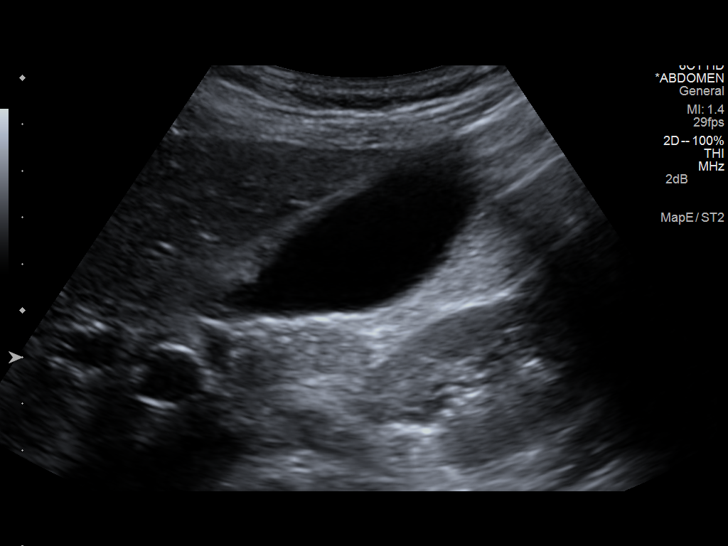
[im 47/87]
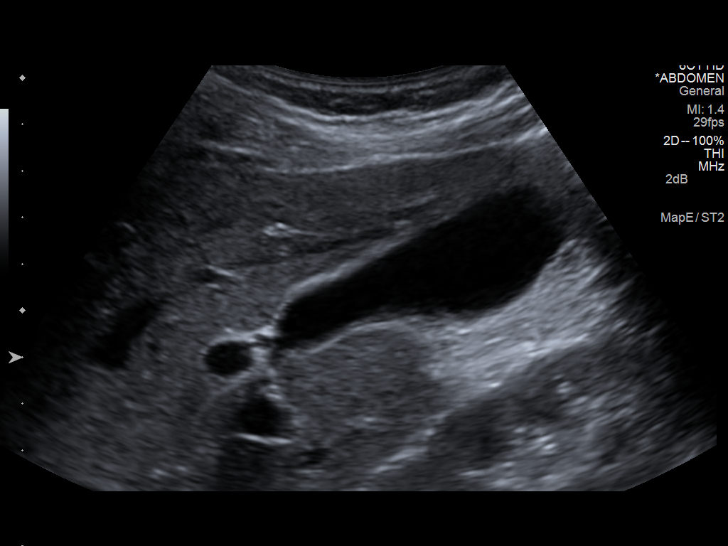
[im 54/87]
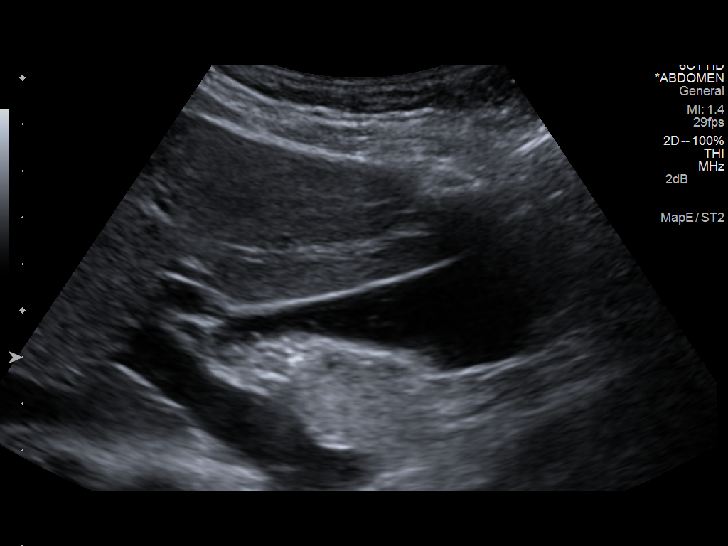
[im 58/87]
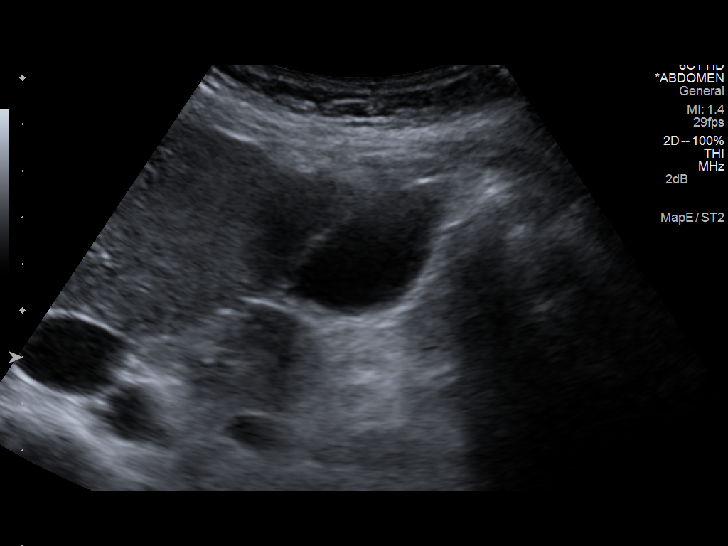
[im 65/87]
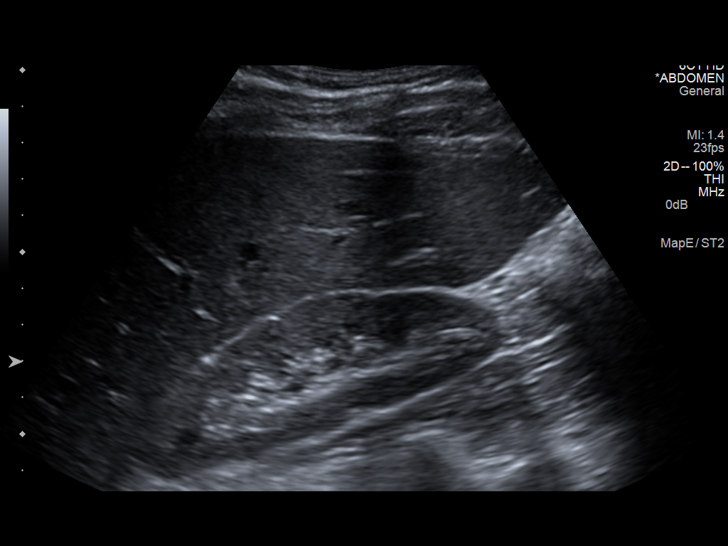
[im 72/87]
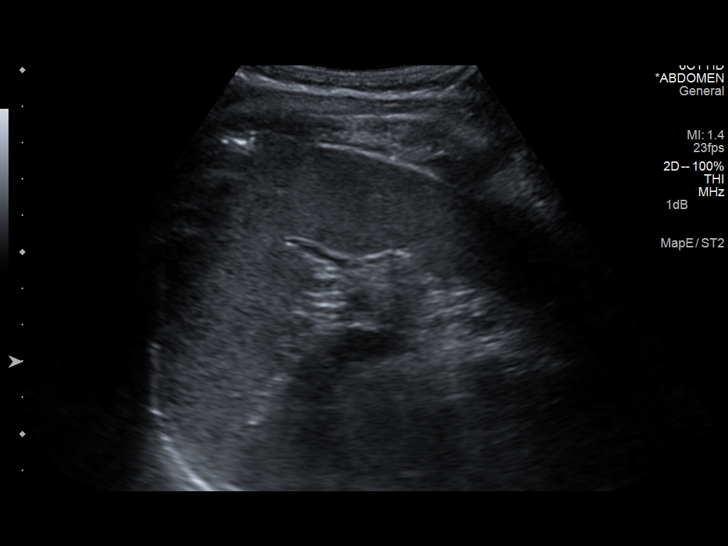
[im 79/87]
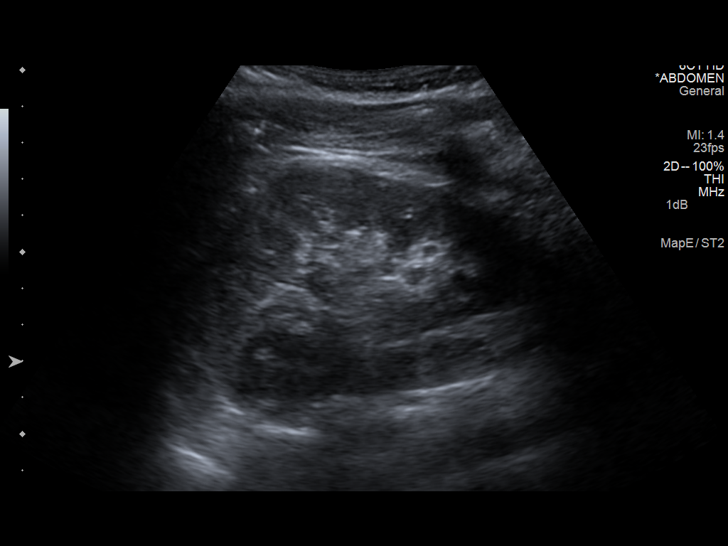
[im 87/87]
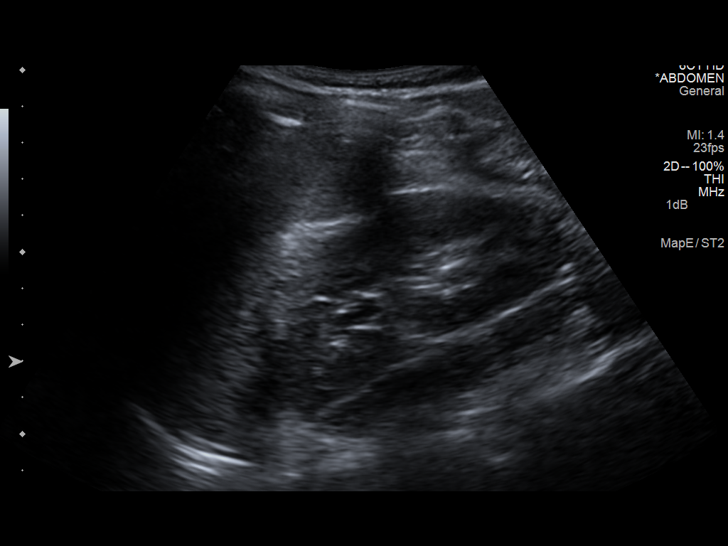

[14 of 25 positions shown; findings below may reference images not displayed]

FINDINGS: Gallbladder:

No gallstones or wall thickening visualized. There is no
pericholecystic fluid. No sonographic Murphy sign noted.

Common bile duct:

Diameter: 3 mm. There is no intrahepatic, common hepatic, or common
bile duct dilatation.

Liver:

No focal lesion identified. Within normal limits in parenchymal
echogenicity.

IVC:

No abnormality visualized.

Pancreas:

No mass or inflammatory focus.

Spleen:

Size and appearance within normal limits.

Right Kidney:

Length: 10.6 cm. Echogenicity within normal limits. No mass or
hydronephrosis visualized.

Left Kidney:

Length: 10.1 cm. Echogenicity within normal limits. No mass or
hydronephrosis visualized.

Abdominal aorta:

No aneurysm visualized.

Other findings:

No demonstrable ascites.
IMPRESSION: Study within normal limits.

## 2014-09-07 IMAGING — NM NM HEPATO W/GB/PHARM/[PERSON_NAME]
1 series · 12 of 12 positions shown · non-contrast
Comparison: None.

RADIOPHARMACEUTICALS:  K.7m5i Tc-DDm Choletec

CLINICAL DATA: Right upper quadrant pain

EXAM:
NUCLEAR MEDICINE HEPATOBILIARY IMAGING WITH GALLBLADDER EF
TECHNIQUE: Sequential images of the abdomen were obtained [DATE] minutes
following intravenous administration of radiopharmaceutical. After
oral ingestion of 8 ounces of Half-and-Half cream, gallbladder
ejection fraction was determined.

[Series 1: hepato · 4.46mm/px · 2 acquisitions, 12 frames shown]
[im 1/2]
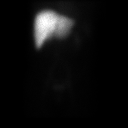
[im 1/2]
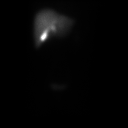
[im 1/2]
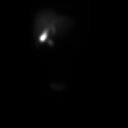
[im 1/2]
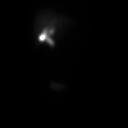
[im 1/2]
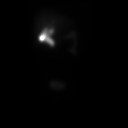
[im 1/2]
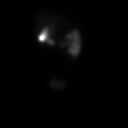
[im 2/2]
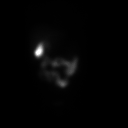
[im 2/2]
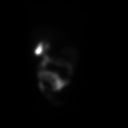
[im 2/2]
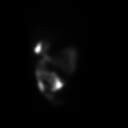
[im 2/2]
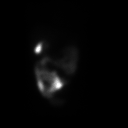
[im 2/2]
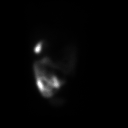
[im 2/2]
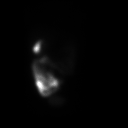

[12 of 12 positions shown; findings below may reference images not displayed]

FINDINGS: Gallbladder activity occurs after 10 min. Small bowel activity
occurs after 25 min. Gallbladder ejection fraction is 57%. Normal is
greater than 33%.

The patient did experience slight pain symptoms after oral ingestion
of Half-and-Half cream.
IMPRESSION: Cystic and common bile ducts are patent. Normal gallbladder ejection
fraction.

## 2014-09-10 IMAGING — MG MM DIAGNOSTIC UNILATERAL R
2 series · 2 of 2 positions shown · non-contrast
Comparison: Prior exams

CLINICAL DATA: Questioned distortion medial right breast . History
of breast cancer in the patient's mother at age 45.

EXAM:
DIGITAL DIAGNOSTIC  right MAMMOGRAM WITH CAD

[R CC]
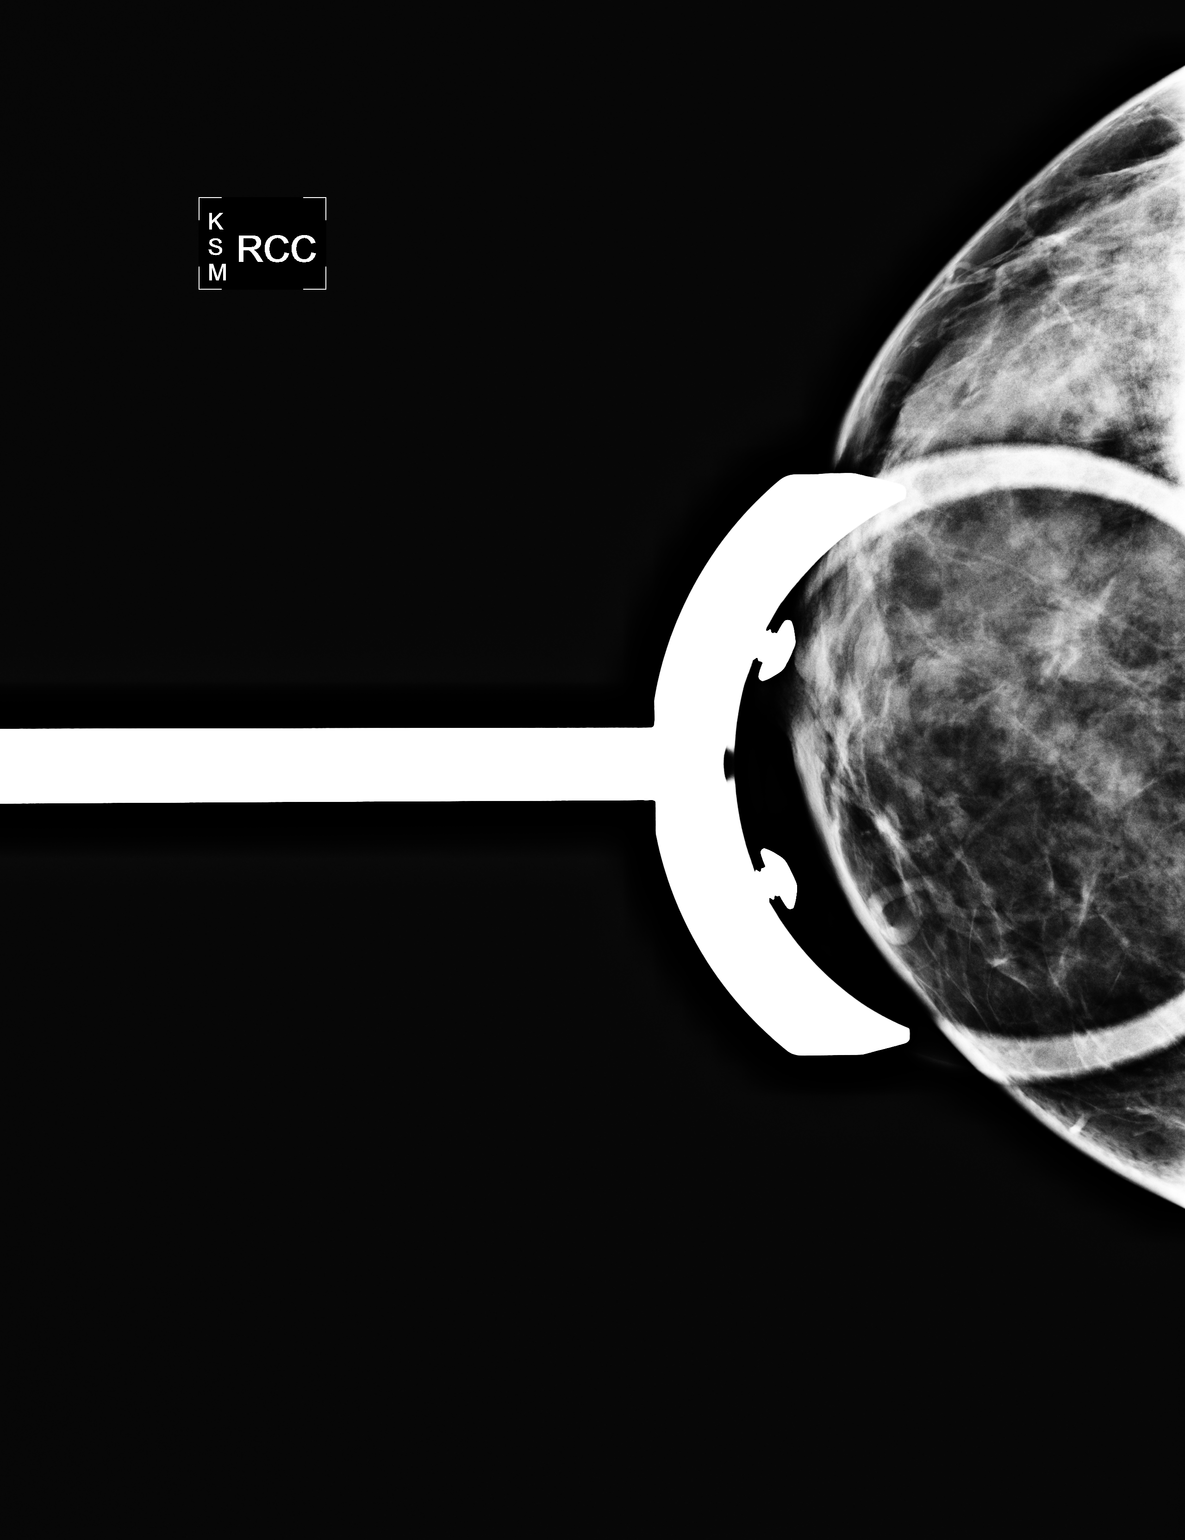

[R ML]
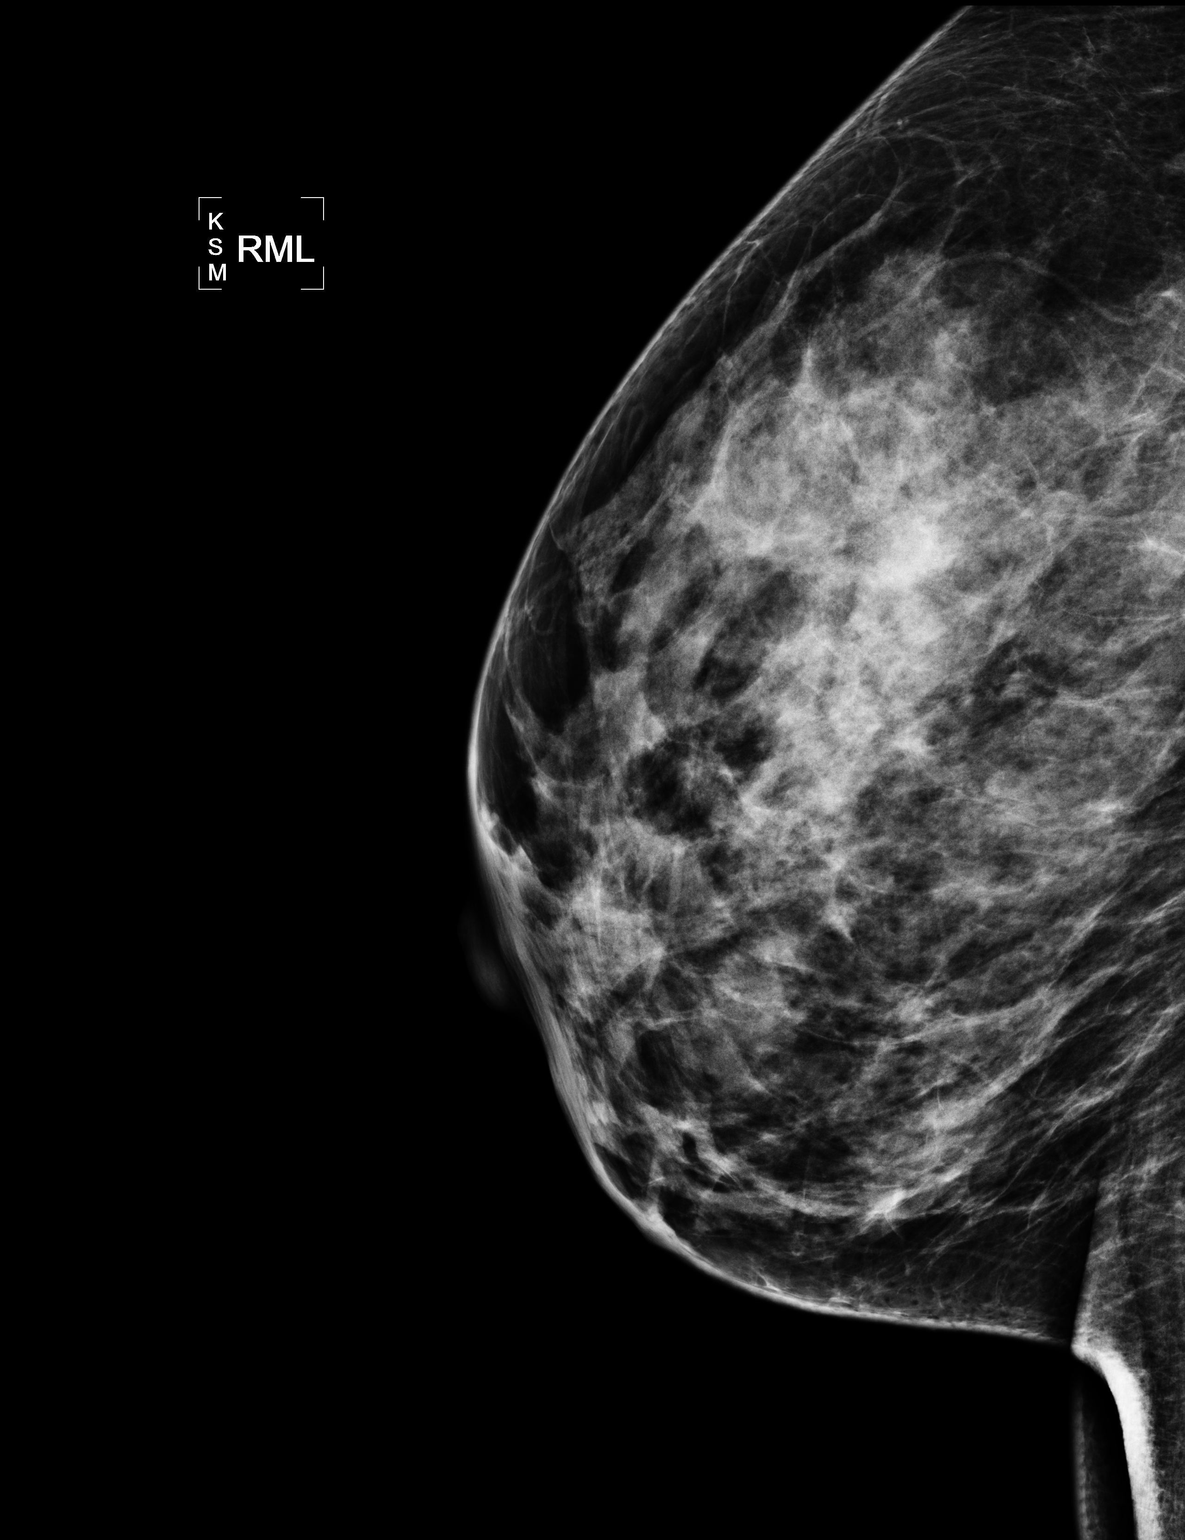

[2 of 2 positions shown; findings below may reference images not displayed]

ACR Breast Density Category c: The breast tissue is heterogeneously
dense, which may obscure small masses.
FINDINGS: The questioned area of distortion is not reproduced in the medial
right breast. No abnormality is identified in the right breast.

Mammographic images were processed with CAD.
IMPRESSION: No evidence for malignancy in the right breast.

RECOMMENDATION:
Screening mammogram in one year.(Code:NC-P-LKV)

I have discussed the findings and recommendations with the patient.
Results were also provided in writing at the conclusion of the
visit. If applicable, a reminder letter will be sent to the patient
regarding the next appointment.

BI-RADS CATEGORY  1: Negative

## 2014-11-28 ENCOUNTER — Other Ambulatory Visit: Payer: Self-pay | Admitting: Obstetrics and Gynecology

## 2014-11-29 LAB — CYTOLOGY - PAP

## 2014-12-26 DIAGNOSIS — Z0289 Encounter for other administrative examinations: Secondary | ICD-10-CM

## 2016-12-05 ENCOUNTER — Other Ambulatory Visit: Payer: Self-pay | Admitting: Obstetrics and Gynecology

## 2016-12-05 DIAGNOSIS — R928 Other abnormal and inconclusive findings on diagnostic imaging of breast: Secondary | ICD-10-CM

## 2016-12-10 ENCOUNTER — Ambulatory Visit
Admission: RE | Admit: 2016-12-10 | Discharge: 2016-12-10 | Disposition: A | Discharge status: Home / Self Care | Payer: BLUE CROSS/BLUE SHIELD | Source: Ambulatory Visit | Attending: Obstetrics and Gynecology | Admitting: Obstetrics and Gynecology

## 2016-12-10 DIAGNOSIS — R928 Other abnormal and inconclusive findings on diagnostic imaging of breast: Secondary | ICD-10-CM

## 2020-03-13 ENCOUNTER — Other Ambulatory Visit: Payer: Self-pay | Admitting: Obstetrics and Gynecology

## 2020-03-13 DIAGNOSIS — R928 Other abnormal and inconclusive findings on diagnostic imaging of breast: Secondary | ICD-10-CM

## 2020-03-21 ENCOUNTER — Ambulatory Visit
Admission: RE | Admit: 2020-03-21 | Discharge: 2020-03-21 | Disposition: A | Discharge status: Home / Self Care | Payer: BC Managed Care – PPO | Source: Ambulatory Visit | Attending: Obstetrics and Gynecology | Admitting: Obstetrics and Gynecology

## 2020-03-21 ENCOUNTER — Other Ambulatory Visit: Payer: Self-pay

## 2020-03-21 ENCOUNTER — Ambulatory Visit: Payer: BLUE CROSS/BLUE SHIELD

## 2020-03-21 DIAGNOSIS — R928 Other abnormal and inconclusive findings on diagnostic imaging of breast: Secondary | ICD-10-CM

## 2020-08-13 ENCOUNTER — Other Ambulatory Visit: Payer: Self-pay | Admitting: Obstetrics and Gynecology

## 2020-08-13 DIAGNOSIS — Z803 Family history of malignant neoplasm of breast: Secondary | ICD-10-CM

## 2020-08-23 ENCOUNTER — Ambulatory Visit
Admission: RE | Admit: 2020-08-23 | Discharge: 2020-08-23 | Disposition: A | Discharge status: Home / Self Care | Payer: BC Managed Care – PPO | Source: Ambulatory Visit | Attending: Obstetrics and Gynecology | Admitting: Obstetrics and Gynecology

## 2020-08-23 ENCOUNTER — Other Ambulatory Visit: Payer: Self-pay

## 2020-08-23 DIAGNOSIS — Z803 Family history of malignant neoplasm of breast: Secondary | ICD-10-CM

## 2020-08-23 MED ORDER — GADOBUTROL 1 MMOL/ML IV SOLN
7.0000 mL | Freq: Once | INTRAVENOUS | Status: AC | PRN
  Administered 2020-08-23: 7 mL via INTRAVENOUS

## 2021-04-16 ENCOUNTER — Other Ambulatory Visit: Payer: Self-pay | Admitting: Obstetrics and Gynecology

## 2021-04-16 DIAGNOSIS — R928 Other abnormal and inconclusive findings on diagnostic imaging of breast: Secondary | ICD-10-CM

## 2021-04-18 ENCOUNTER — Ambulatory Visit
Admission: RE | Admit: 2021-04-18 | Discharge: 2021-04-18 | Disposition: A | Discharge status: Home / Self Care | Payer: BC Managed Care – PPO | Source: Ambulatory Visit | Attending: Obstetrics and Gynecology | Admitting: Obstetrics and Gynecology

## 2021-04-18 ENCOUNTER — Other Ambulatory Visit: Payer: Self-pay

## 2021-04-18 DIAGNOSIS — R928 Other abnormal and inconclusive findings on diagnostic imaging of breast: Secondary | ICD-10-CM

## 2021-05-07 ENCOUNTER — Other Ambulatory Visit: Payer: BC Managed Care – PPO

## 2021-09-26 ENCOUNTER — Other Ambulatory Visit: Payer: Self-pay | Admitting: Obstetrics and Gynecology

## 2021-09-26 DIAGNOSIS — Z803 Family history of malignant neoplasm of breast: Secondary | ICD-10-CM

## 2021-12-25 ENCOUNTER — Other Ambulatory Visit: Payer: Self-pay

## 2021-12-25 ENCOUNTER — Ambulatory Visit (INDEPENDENT_AMBULATORY_CARE_PROVIDER_SITE_OTHER): Payer: BC Managed Care – PPO | Admitting: Vascular Surgery

## 2021-12-25 ENCOUNTER — Encounter: Payer: Self-pay | Admitting: Vascular Surgery

## 2021-12-25 VITALS — BP 124/83 | HR 75 | Temp 98.3°F | Resp 14 | Ht 63.0 in | Wt 144.0 lb

## 2021-12-25 DIAGNOSIS — I781 Nevus, non-neoplastic: Secondary | ICD-10-CM | POA: Diagnosis not present

## 2021-12-25 NOTE — Progress Notes (Signed)
? ?ASSESSMENT & PLAN  ? 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  We have discussed the importance of intermittent leg elevation and the proper positioning for this.  I have encouraged her to wear compression stockings when she is traveling or will be standing for prolonged period of time.  We discussed the importance of exercise specifically walking and water aerobics.  I encouraged her to avoid prolonged sitting and standing.  I do not think she has evidence of significant underlying venous insufficiency.  I think she would be a good candidate for sclerotherapy for her telangiectasias in both lower extremities.  I have asked Cherene Julian, RN to discuss this with her.  I will see her back as needed. ? ?REASON FOR CONSULT:   ? ?Bilateral lower extremity varicose veins.  The patient is self-referred. ? ?HPI:  ? ?Kelsey [name] is a 46 y.o. female who is self-referred for the evaluation of spider veins. I have reviewed the records from her recent dermatology visit on November 27, 2021.  She was noted to have evidence of venous stasis.  On my history, the patient developed spider veins about a year ago and these have progressed slightly over the last year.  She was concerned about these. ? ?She denies any aching pain heaviness or tight feeling in her legs associated with prolonged sitting or standing.  She denies any significant swelling.  She is very active. ? ?Past Medical History:  ?Diagnosis Date  ? Anxiety   ? Dizziness   ? Migraine headache   ? ? ?Family History  ?Problem Relation Age of Onset  ? Breast cancer Mother 73  ? Cancer Mother   ?     breast  ? Cancer Maternal Grandmother   ?     ovarian  ? Cancer Paternal Grandmother   ?     bladder  ? Breast cancer Cousin 67  ? Breast cancer Cousin 30  ? Lupus Other   ?     family hx of  ? ? ?SOCIAL HISTORY: ?Social History  ? ?Tobacco Use  ? Smoking status: Never  ? Smokeless tobacco: Never  ?Substance  Use Topics  ? Alcohol use: Yes  ?  Comment: 1-2 per week  ? ? ?No Known Allergies ? ?Current Outpatient Medications  ?Medication Sig Dispense Refill  ? etonogestrel-ethinyl estradiol (NUVARING) 0.12-0.015 MG/24HR vaginal ring Place 1 each vaginally every 28 (twenty-eight) days. Insert vaginally and leave in place for 3 consecutive weeks, then remove for 1 week.     ? ?No current facility-administered medications for this visit.  ? ? ?REVIEW OF SYSTEMS:  ?[X]  denotes positive finding, [ ]  denotes negative finding ?Cardiac  Comments:  ?Chest pain or chest pressure:    ?Shortness of breath upon exertion:    ?Short of breath when lying flat:    ?Irregular heart rhythm:    ?    ?Vascular    ?Pain in calf, thigh, or hip brought on by ambulation:    ?Pain in feet at night that wakes you up from your sleep:     ?Blood clot in your veins:    ?Leg swelling:     ?    ?Pulmonary    ?Oxygen at home:    ?Productive cough:     ?Wheezing:     ?    ?Neurologic    ?Sudden weakness in arms or legs:     ?Sudden numbness in arms or legs:     ?  Sudden onset of difficulty speaking or slurred speech:    ?Temporary loss of vision in one eye:     ?Problems with dizziness:     ?    ?Gastrointestinal    ?Blood in stool:     ?Vomited blood:     ?    ?Genitourinary    ?Burning when urinating:     ?Blood in urine:    ?    ?Psychiatric    ?Major depression:     ?    ?Hematologic    ?Bleeding problems:    ?Problems with blood clotting too easily:    ?    ?Skin    ?Rashes or ulcers:    ?    ?Constitutional    ?Fever or chills:    ?- ? ?PHYSICAL EXAM:  ? ?Vitals:  ? 12/25/21 0837  ?BP: 124/83  ?Pulse: 75  ?Resp: 14  ?Temp: 98.3 ?F (36.8 ?C)  ?TempSrc: Temporal  ?SpO2: 99%  ?Weight: 144 lb (65.3 kg)  ?Height: 5\' 3"  (1.6 m)  ? ?Body mass index is 25.51 kg/m?. ?GENERAL: The patient is a well-nourished female, in no acute distress. The vital signs are documented above. ?CARDIAC: There is a regular rate and rhythm.  ?VASCULAR: I do not detect carotid  bruits. ?She has palpable pedal pulses. ?She has small telangiectasias in the thighs and legs bilaterally. ? ? ? ? ? ? ? ? ?PULMONARY: There is good air exchange bilaterally without wheezing or rales. ?MUSCULOSKELETAL: There are no major deformities. ?NEUROLOGIC: No focal weakness or paresthesias are detected. ?SKIN: There are no ulcers or rashes noted. ?PSYCHIATRIC: The patient has a normal affect. ? ?DATA:   ? ?No new data ? ? ?[facility] ?

## 2021-12-27 ENCOUNTER — Encounter: Payer: BC Managed Care – PPO | Admitting: Vascular Surgery

## 2021-12-27 ENCOUNTER — Encounter (HOSPITAL_COMMUNITY): Payer: BC Managed Care – PPO

## 2022-04-10 ENCOUNTER — Other Ambulatory Visit: Payer: Self-pay | Admitting: Obstetrics and Gynecology

## 2022-04-10 DIAGNOSIS — Z1231 Encounter for screening mammogram for malignant neoplasm of breast: Secondary | ICD-10-CM

## 2022-04-29 ENCOUNTER — Ambulatory Visit: Payer: BC Managed Care – PPO

## 2022-05-01 ENCOUNTER — Ambulatory Visit
Admission: RE | Admit: 2022-05-01 | Discharge: 2022-05-01 | Disposition: A | Discharge status: Home / Self Care | Payer: BC Managed Care – PPO | Source: Ambulatory Visit | Attending: Obstetrics and Gynecology | Admitting: Obstetrics and Gynecology

## 2022-05-01 DIAGNOSIS — Z1231 Encounter for screening mammogram for malignant neoplasm of breast: Secondary | ICD-10-CM

## 2023-04-03 ENCOUNTER — Other Ambulatory Visit: Payer: Self-pay | Admitting: Obstetrics and Gynecology

## 2023-04-03 DIAGNOSIS — Z1231 Encounter for screening mammogram for malignant neoplasm of breast: Secondary | ICD-10-CM

## 2023-05-06 ENCOUNTER — Ambulatory Visit: Payer: BC Managed Care – PPO

## 2023-05-07 ENCOUNTER — Ambulatory Visit
Admission: RE | Admit: 2023-05-07 | Discharge: 2023-05-07 | Disposition: A | Discharge status: Home / Self Care | Payer: BC Managed Care – PPO | Source: Ambulatory Visit | Attending: Obstetrics and Gynecology | Admitting: Obstetrics and Gynecology

## 2023-05-07 DIAGNOSIS — Z1231 Encounter for screening mammogram for malignant neoplasm of breast: Secondary | ICD-10-CM

## 2023-08-20 ENCOUNTER — Other Ambulatory Visit: Payer: Self-pay | Admitting: Obstetrics and Gynecology

## 2023-08-20 DIAGNOSIS — Z803 Family history of malignant neoplasm of breast: Secondary | ICD-10-CM

## 2023-09-01 ENCOUNTER — Other Ambulatory Visit: Payer: Self-pay | Admitting: Family

## 2023-09-01 DIAGNOSIS — N644 Mastodynia: Secondary | ICD-10-CM

## 2023-09-21 ENCOUNTER — Ambulatory Visit
Admission: RE | Admit: 2023-09-21 | Discharge: 2023-09-21 | Disposition: A | Discharge status: Home / Self Care | Payer: BC Managed Care – PPO | Source: Ambulatory Visit | Attending: Family | Admitting: Family

## 2023-09-21 DIAGNOSIS — N644 Mastodynia: Secondary | ICD-10-CM

## 2023-10-06 ENCOUNTER — Other Ambulatory Visit: Payer: BC Managed Care – PPO

## 2023-11-12 ENCOUNTER — Other Ambulatory Visit (HOSPITAL_BASED_OUTPATIENT_CLINIC_OR_DEPARTMENT_OTHER): Payer: Self-pay | Admitting: Obstetrics and Gynecology

## 2023-11-12 DIAGNOSIS — Z8249 Family history of ischemic heart disease and other diseases of the circulatory system: Secondary | ICD-10-CM

## 2023-11-24 ENCOUNTER — Other Ambulatory Visit: Payer: BC Managed Care – PPO

## 2023-12-25 ENCOUNTER — Ambulatory Visit (HOSPITAL_BASED_OUTPATIENT_CLINIC_OR_DEPARTMENT_OTHER)
Admission: RE | Admit: 2023-12-25 | Discharge: 2023-12-25 | Disposition: A | Discharge status: Home / Self Care | Payer: Self-pay | Source: Ambulatory Visit | Attending: Obstetrics and Gynecology | Admitting: Obstetrics and Gynecology

## 2023-12-25 DIAGNOSIS — Z8249 Family history of ischemic heart disease and other diseases of the circulatory system: Secondary | ICD-10-CM | POA: Insufficient documentation

## 2024-05-10 ENCOUNTER — Other Ambulatory Visit: Payer: Self-pay | Admitting: Obstetrics and Gynecology

## 2024-05-10 DIAGNOSIS — Z1231 Encounter for screening mammogram for malignant neoplasm of breast: Secondary | ICD-10-CM

## 2024-06-08 ENCOUNTER — Ambulatory Visit

## 2024-06-10 ENCOUNTER — Ambulatory Visit
Admission: RE | Admit: 2024-06-10 | Discharge: 2024-06-10 | Disposition: A | Discharge status: Home / Self Care | Source: Ambulatory Visit | Attending: Obstetrics and Gynecology | Admitting: Obstetrics and Gynecology

## 2024-06-10 DIAGNOSIS — Z1231 Encounter for screening mammogram for malignant neoplasm of breast: Secondary | ICD-10-CM
